# Patient Record
Sex: Male | Born: 1960
Health system: Southern US, Community
[De-identification: ages and names within clinical notes are randomized; demographics above are authoritative.]

## PROBLEM LIST (undated history)

## (undated) DIAGNOSIS — E7800 Pure hypercholesterolemia, unspecified: Secondary | ICD-10-CM

## (undated) DIAGNOSIS — I2510 Atherosclerotic heart disease of native coronary artery without angina pectoris: Secondary | ICD-10-CM

## (undated) DIAGNOSIS — I10 Essential (primary) hypertension: Secondary | ICD-10-CM

## (undated) DIAGNOSIS — M5126 Other intervertebral disc displacement, lumbar region: Secondary | ICD-10-CM

## (undated) HISTORY — DX: Atherosclerotic heart disease of native coronary artery without angina pectoris: I25.10

## (undated) HISTORY — DX: Other intervertebral disc displacement, lumbar region: M51.26

## (undated) HISTORY — PX: WRIST SURGERY: SHX841

---

## 1997-07-10 ENCOUNTER — Ambulatory Visit (HOSPITAL_COMMUNITY): Admission: RE | Admit: 1997-07-10 | Discharge: 1997-07-10 | Payer: Self-pay | Admitting: Orthopedic Surgery

## 1997-08-19 ENCOUNTER — Ambulatory Visit (HOSPITAL_BASED_OUTPATIENT_CLINIC_OR_DEPARTMENT_OTHER): Admission: RE | Admit: 1997-08-19 | Discharge: 1997-08-19 | Payer: Self-pay | Admitting: Orthopedic Surgery

## 1998-06-02 ENCOUNTER — Ambulatory Visit (HOSPITAL_BASED_OUTPATIENT_CLINIC_OR_DEPARTMENT_OTHER): Admission: RE | Admit: 1998-06-02 | Discharge: 1998-06-02 | Payer: Self-pay | Admitting: Orthopedic Surgery

## 2000-11-18 ENCOUNTER — Emergency Department (HOSPITAL_COMMUNITY): Admission: EM | Admit: 2000-11-18 | Discharge: 2000-11-18 | Payer: Self-pay | Admitting: Emergency Medicine

## 2000-12-19 ENCOUNTER — Emergency Department (HOSPITAL_COMMUNITY): Admission: EM | Admit: 2000-12-19 | Discharge: 2000-12-19 | Payer: Self-pay | Admitting: Emergency Medicine

## 2004-06-18 ENCOUNTER — Emergency Department (HOSPITAL_COMMUNITY): Admission: EM | Admit: 2004-06-18 | Discharge: 2004-06-19 | Payer: Self-pay | Admitting: Emergency Medicine

## 2007-07-05 ENCOUNTER — Encounter: Admission: RE | Admit: 2007-07-05 | Discharge: 2007-07-05 | Payer: Self-pay | Admitting: Gastroenterology

## 2008-06-15 ENCOUNTER — Ambulatory Visit (HOSPITAL_COMMUNITY): Admission: RE | Admit: 2008-06-15 | Discharge: 2008-06-15 | Payer: Self-pay | Admitting: Chiropractic Medicine

## 2010-03-27 ENCOUNTER — Encounter: Payer: Self-pay | Admitting: Gastroenterology

## 2010-08-25 ENCOUNTER — Ambulatory Visit: Payer: Self-pay | Attending: Orthopedic Surgery | Admitting: Occupational Therapy

## 2010-08-25 ENCOUNTER — Emergency Department (HOSPITAL_COMMUNITY): Payer: Self-pay

## 2010-08-25 ENCOUNTER — Emergency Department (HOSPITAL_COMMUNITY)
Admission: EM | Admit: 2010-08-25 | Discharge: 2010-08-25 | Disposition: A | Discharge status: Home / Self Care | Payer: Self-pay | Attending: Emergency Medicine | Admitting: Emergency Medicine

## 2010-08-25 DIAGNOSIS — IMO0001 Reserved for inherently not codable concepts without codable children: Secondary | ICD-10-CM | POA: Insufficient documentation

## 2010-08-25 DIAGNOSIS — M25539 Pain in unspecified wrist: Secondary | ICD-10-CM | POA: Insufficient documentation

## 2010-08-25 DIAGNOSIS — W3400XA Accidental discharge from unspecified firearms or gun, initial encounter: Secondary | ICD-10-CM | POA: Insufficient documentation

## 2010-08-25 DIAGNOSIS — S51809A Unspecified open wound of unspecified forearm, initial encounter: Secondary | ICD-10-CM | POA: Insufficient documentation

## 2010-08-25 DIAGNOSIS — R609 Edema, unspecified: Secondary | ICD-10-CM | POA: Insufficient documentation

## 2010-09-01 ENCOUNTER — Ambulatory Visit: Payer: Self-pay | Admitting: Occupational Therapy

## 2010-09-22 ENCOUNTER — Ambulatory Visit: Payer: Self-pay | Attending: Orthopedic Surgery | Admitting: Occupational Therapy

## 2010-09-22 DIAGNOSIS — IMO0001 Reserved for inherently not codable concepts without codable children: Secondary | ICD-10-CM | POA: Insufficient documentation

## 2010-09-22 DIAGNOSIS — R609 Edema, unspecified: Secondary | ICD-10-CM | POA: Insufficient documentation

## 2010-09-22 DIAGNOSIS — M25539 Pain in unspecified wrist: Secondary | ICD-10-CM | POA: Insufficient documentation

## 2012-05-28 ENCOUNTER — Encounter (HOSPITAL_COMMUNITY): Payer: Self-pay

## 2012-05-28 ENCOUNTER — Emergency Department (HOSPITAL_COMMUNITY)
Admission: EM | Admit: 2012-05-28 | Discharge: 2012-05-28 | Disposition: A | Discharge status: Home / Self Care | Payer: Self-pay | Attending: Emergency Medicine | Admitting: Emergency Medicine

## 2012-05-28 ENCOUNTER — Emergency Department (HOSPITAL_COMMUNITY): Payer: Self-pay

## 2012-05-28 DIAGNOSIS — R4701 Aphasia: Secondary | ICD-10-CM | POA: Insufficient documentation

## 2012-05-28 DIAGNOSIS — Z8639 Personal history of other endocrine, nutritional and metabolic disease: Secondary | ICD-10-CM | POA: Insufficient documentation

## 2012-05-28 DIAGNOSIS — G510 Bell's palsy: Secondary | ICD-10-CM | POA: Insufficient documentation

## 2012-05-28 DIAGNOSIS — Z862 Personal history of diseases of the blood and blood-forming organs and certain disorders involving the immune mechanism: Secondary | ICD-10-CM | POA: Insufficient documentation

## 2012-05-28 HISTORY — DX: Pure hypercholesterolemia, unspecified: E78.00

## 2012-05-28 HISTORY — DX: Essential (primary) hypertension: I10

## 2012-05-28 LAB — POCT I-STAT, CHEM 8
Creatinine, Ser: 1.1 mg/dL (ref 0.50–1.35)
HCT: 52 % (ref 39.0–52.0)
Hemoglobin: 17.7 g/dL — ABNORMAL HIGH (ref 13.0–17.0)
Potassium: 3.6 mEq/L (ref 3.5–5.1)
Sodium: 140 mEq/L (ref 135–145)

## 2012-05-28 LAB — CBC
MCV: 87.2 fL (ref 78.0–100.0)
Platelets: 219 10*3/uL (ref 150–400)
RBC: 5.47 MIL/uL (ref 4.22–5.81)
WBC: 6.2 10*3/uL (ref 4.0–10.5)

## 2012-05-28 LAB — GLUCOSE, CAPILLARY: Glucose-Capillary: 152 mg/dL — ABNORMAL HIGH (ref 70–99)

## 2012-05-28 LAB — PROTIME-INR
INR: 0.95 (ref 0.00–1.49)
Prothrombin Time: 12.6 seconds (ref 11.6–15.2)

## 2012-05-28 LAB — POCT I-STAT TROPONIN I: Troponin i, poc: 0 ng/mL (ref 0.00–0.08)

## 2012-05-28 MED ORDER — ACYCLOVIR 400 MG PO TABS: 400.0000 mg | ORAL_TABLET | Freq: Four times a day (QID) | ORAL | Status: DC

## 2012-05-28 MED ORDER — PREDNISONE 10 MG PO TABS: 50.0000 mg | ORAL_TABLET | Freq: Every day | ORAL | Status: DC

## 2012-05-28 MED ORDER — REFRESH P.M. OP OINT: TOPICAL_OINTMENT | OPHTHALMIC | Status: DC | PRN

## 2012-05-28 MED ORDER — TEARS RENEWED OP SOLN: 1.0000 [drp] | Freq: Four times a day (QID) | OPHTHALMIC | Status: DC | PRN

## 2012-05-28 NOTE — ED Notes (Signed)
Patient presents with c/o hypertension. States that he routinely monitors his blood pressure at home. Started noticing that his speech was slurred around 5:30 pm this evening while speaking with his daughter on the telephone and continued to be slurred. Also c/o difficulty closing left eye and left ear pain x 1 day. States that it feels like it's throbbing. Thinks that he may have an ear infection. At triage, patient noted to have slurred speech and left facial droop. No unilateral weakness. Grips present and equal bilaterally. Dr. Read Drivers to triage to see patient. Per Dr. Read Drivers, patient does not seem to be having a stroke at this times and he feels as if the patient's sx is consistent with Bell's Palsy.

## 2012-05-28 NOTE — ED Provider Notes (Addendum)
History     CSN: [id number]  Arrival date & time 05/28/12  0110   First MD Initiated Contact with Patient 05/28/12 0125      Chief Complaint  Patient presents with  . Hypertension    (Consider location/radiation/quality/duration/timing/severity/associated sxs/prior treatment) Patient is a 52 y.o. male presenting with hypertension. The history is provided by the patient.  Hypertension This is a new problem. The current episode started 6 to 12 hours ago. The problem occurs constantly. The problem has not changed since onset.Nothing aggravates the symptoms. Nothing relieves the symptoms. He has tried nothing for the symptoms.    Past Medical History  Diagnosis Date  . Hypertension   . High cholesterol     Past Surgical History  Procedure Laterality Date  . Wrist surgery Right     No family history on file.  History  Substance Use Topics  . Smoking status: Never Smoker   . Smokeless tobacco: Never Used  . Alcohol Use: Yes     Comment: occasionally       Review of Systems  Neurological: Positive for speech difficulty.  All other systems reviewed and are negative.    Allergies  Review of patient's allergies indicates no known allergies.  Home Medications  No current outpatient prescriptions on file.  BP 179/110  Temp(Src) 98.3 F (36.8 C) (Oral)  Resp 18  SpO2 96%  Physical Exam  Constitutional: He is oriented to person, place, and time. He appears well-developed and well-nourished.  HENT:  Head: Normocephalic and atraumatic.  Eyes: Conjunctivae are normal. Pupils are equal, round, and reactive to light.  Neck: Normal range of motion. Neck supple.  Cardiovascular: Normal rate, regular rhythm, normal heart sounds and intact distal pulses.   Pulmonary/Chest: Effort normal and breath sounds normal.  Abdominal: Soft. Bowel sounds are normal.  Neurological: He is alert and oriented to person, place, and time. A cranial nerve deficit is present.  Isolated  left facial palsy.  Skin: Skin is warm and dry.  Psychiatric: He has a normal mood and affect. His behavior is normal. Judgment and thought content normal.    ED Course  Procedures (including critical care time)  Labs Reviewed  CBC  PROTIME-INR   No results found.   No diagnosis found.    MDM  + htn,  Left facial droop isolated to cn 7.  Will lab,  Ct,  reassesx   Ct neg.  Isolated 7th nerve palsy.  Will dc to [facility] neurology,  Ret new/worsening sxs     Yoshiye Kraft Lytle Michaels, MD 05/28/12 0146  Delbert Vu Lytle Michaels, MD 05/28/12 [phone]

## 2013-06-30 ENCOUNTER — Other Ambulatory Visit: Payer: Self-pay | Admitting: Physical Medicine and Rehabilitation

## 2013-06-30 DIAGNOSIS — M549 Dorsalgia, unspecified: Secondary | ICD-10-CM

## 2013-06-30 DIAGNOSIS — M545 Low back pain, unspecified: Secondary | ICD-10-CM

## 2013-06-30 DIAGNOSIS — Z139 Encounter for screening, unspecified: Secondary | ICD-10-CM

## 2013-07-02 ENCOUNTER — Inpatient Hospital Stay: Admission: RE | Admit: 2013-07-02 | Payer: Self-pay | Source: Ambulatory Visit

## 2013-07-02 ENCOUNTER — Other Ambulatory Visit: Payer: Self-pay

## 2013-07-30 ENCOUNTER — Encounter: Payer: Self-pay | Admitting: *Deleted

## 2013-07-31 ENCOUNTER — Ambulatory Visit (INDEPENDENT_AMBULATORY_CARE_PROVIDER_SITE_OTHER): Payer: BC Managed Care – PPO | Admitting: Neurology

## 2013-07-31 ENCOUNTER — Encounter (INDEPENDENT_AMBULATORY_CARE_PROVIDER_SITE_OTHER): Payer: Self-pay

## 2013-07-31 DIAGNOSIS — G510 Bell's palsy: Secondary | ICD-10-CM

## 2013-07-31 DIAGNOSIS — R51 Headache: Secondary | ICD-10-CM

## 2013-07-31 DIAGNOSIS — R29810 Facial weakness: Secondary | ICD-10-CM

## 2013-07-31 HISTORY — DX: Bell's palsy: G51.0

## 2013-07-31 NOTE — Addendum Note (Signed)
Addended by: Ramond Marrow on: 07/31/2013 09:15 AM   Modules accepted: Orders, Level of Service

## 2013-07-31 NOTE — Progress Notes (Addendum)
[facility]    Provider:  Dr Hosie Poisson Referring Provider: [name]* Primary Care Physician:  Default, Provider, MD  CC:  Bells palsy  HPI:  John [name] is a 53 y.o. male here as a referral from Dr. Clint [name] for Bells palsy  States symptoms started March 2014, notes initial sensation of numbness on his tongue, noted a red spot on his tongue, had sore throat, felt sick, noted some twitching of his left eyelid. His friend noted some slurring of his speech and then realized that the whole left side of his face was drooped. Went to the ED, had normal head CT, diagnosed with Bells, given prednisone and an antiviral. Notes symptoms have progressively gotten better but continues to have some weakness around the mouth and eye. Notes some abnormal facial movements (eye closes with smiling) and tears up when eating. Returns today over prognosis and concern over a lump noted in the left occipital region. He does note a progressively worsening pain over that region that radiates down his back.   Review of Systems: Out of a complete 14 system review, the patient complains of only the following symptoms, and all other reviewed systems are negative. + facial weakness  History   Social History  . Marital Status: Divorced    Spouse Name: N/A    Number of Children: N/A  . Years of Education: N/A   Occupational History  . Not on file.   Social History Main Topics  . Smoking status: Former Games developer  . Smokeless tobacco: Never Used  . Alcohol Use: Yes     Comment: occasionally   . Drug Use: No  . Sexual Activity: Not on file   Other Topics Concern  . Not on file   Social History Narrative  . No narrative on file    Family History  Problem Relation Age of Onset  . Stroke    . High Cholesterol    . Hypertension    . Glaucoma    . Diabetes      Past Medical History  Diagnosis Date  . Hypertension   . High cholesterol   . Displacement of lumbar  intervertebral disc without myelopathy     Past Surgical History  Procedure Laterality Date  . Wrist surgery Right     Current Outpatient Prescriptions  Medication Sig Dispense Refill  . acyclovir (ZOVIRAX) 400 MG tablet Take 1 tablet (400 mg total) by mouth 4 (four) times daily.  50 tablet  0  . Artificial Tear Ointment (ARTIFICIAL TEARS) ointment Place into the left eye as needed.  3.5 g  12  . dextran 70-hypromellose (TEARS RENEWED) ophthalmic solution Place 1 drop into the left eye 4 (four) times daily as needed.  15 mL  12  . gabapentin (NEURONTIN) 400 MG capsule Take 400 mg by mouth 3 (three) times daily.      Marland Kitchen losartan (COZAAR) 100 MG tablet Take 100 mg by mouth daily.      . meloxicam (MOBIC) 7.5 MG tablet Take 7.5 mg by mouth daily.      . pravastatin (PRAVACHOL) 40 MG tablet Take 40 mg by mouth daily.      . predniSONE (DELTASONE) 10 MG tablet Take 5 tablets (50 mg total) by mouth daily.  20 tablet  0  . traMADol (ULTRAM) 50 MG tablet Take 50 mg by mouth every 6 (six) hours as needed.       No current facility-administered medications for this visit.  Allergies as of 07/31/2013  . (No Known Allergies)    Vitals: There were no vitals taken for this visit. Last Weight:  Wt Readings from Last 1 Encounters:  No data found for Wt   Last Height:   Ht Readings from Last 1 Encounters:  No data found for Ht     Physical exam: Exam: Gen: NAD, conversant Eyes: anicteric sclerae, moist conjunctivae HENT: Atraumatic, oropharynx clear, small nodular lump on left occipital region, mobile Neck: Trachea midline; supple,  Lungs: CTA, no wheezing, rales, rhonic                          CV: RRR, no MRG Abdomen: Soft, non-tender;  Extremities: No peripheral edema  Skin: Normal temperature, no rash,  Psych: Appropriate affect, pleasant  Neuro: MS: AA&Ox3, appropriately interactive, normal affect   Attention: WORLD backwards  Speech: fluent w/o paraphasic  error  Memory: good recent and remote recall  CN: PERRL, EOMI no nystagmus, no ptosis, sensation intact to LT V1-V3 bilat, decreased left NLF, asymmetric smile and lid closure, hearing grossly intact, palate elevates symmetrically, shoulder shrug 5/5 bilat,  tongue protrudes midline, no fasiculations noted.  Motor: normal bulk and tone Strength: 5/5  In all extremities  Coord: rapid alternating and point-to-point (FNF, HTS) movements intact.  Reflexes: symmetrical, bilat downgoing toes  Sens: LT intact in all extremities  Gait: posture, stance, stride and arm-swing normal. Tandem gait intact. Able to walk on heels and toes. Romberg absent.   Assessment:  After physical and neurologic examination, review of laboratory studies, imaging, neurophysiology testing and pre-existing records, assessment will be reviewed on the problem list.  Plan:  Treatment plan and additional workup will be reviewed under Problem List.  1)Bells palsy 2)Headache  52y/o gentleman presenting for initial evaluation of chronic Bell's palsy with concern over lack of resolution of symptoms and new occipital head pain. Counseled patient that his findings are consistent with a diagnosis of Bell's palsy and that unfortunately not every case fully resolves. He expressed understanding. Due to worsening head pain will check head CT. If negative can consider increasing gabapentin to 600mg  TID. Follow up once CT completed.    Elspeth ChoPeter Palmina Clodfelter, DO  [facility] [address] [place], [place] [id number]  Phone [phone] Fax [phone]

## 2013-08-06 ENCOUNTER — Telehealth: Payer: Self-pay | Admitting: Neurology

## 2013-08-06 NOTE — Telephone Encounter (Signed)
Patient calling about appointment for CT Scan.  Please return call.

## 2013-08-07 ENCOUNTER — Other Ambulatory Visit: Payer: Self-pay | Admitting: Neurology

## 2013-08-07 DIAGNOSIS — R519 Headache, unspecified: Secondary | ICD-10-CM

## 2013-08-07 DIAGNOSIS — R51 Headache: Secondary | ICD-10-CM

## 2013-08-08 NOTE — Telephone Encounter (Signed)
Called pt to inform him that Dr. Hosie Poisson had put the order in for a CT scan and that someone would be getting in contact with him to set up an appt. I gave pt's information to Dr. Minus Breeding assistant Andreas Blower, CMA. I advised the pt that if he has any other problems, questions or concerns to call the office. Pt verbalized understanding.

## 2013-08-08 NOTE — Telephone Encounter (Signed)
Called patient to inform him that his referral for CT scan, has been faxed over to [facility], also informed him that he will be contacted by [facility] with appointment information, patient had no further questions or concerns.

## 2013-08-13 ENCOUNTER — Telehealth: Payer: Self-pay | Admitting: Neurology

## 2013-08-13 NOTE — Telephone Encounter (Signed)
John [name] with [facility] calling to inform Dr. Hosie Poisson patient cancelled CT scan scheduled for 5/11.

## 2013-08-14 ENCOUNTER — Other Ambulatory Visit: Payer: Self-pay

## 2013-08-28 ENCOUNTER — Other Ambulatory Visit: Payer: Self-pay | Admitting: Physical Medicine and Rehabilitation

## 2013-08-28 DIAGNOSIS — R221 Localized swelling, mass and lump, neck: Secondary | ICD-10-CM

## 2013-08-28 DIAGNOSIS — M545 Low back pain, unspecified: Secondary | ICD-10-CM

## 2013-08-28 DIAGNOSIS — M542 Cervicalgia: Secondary | ICD-10-CM

## 2013-09-09 ENCOUNTER — Other Ambulatory Visit: Payer: Self-pay | Admitting: Physical Medicine and Rehabilitation

## 2013-09-09 DIAGNOSIS — Z139 Encounter for screening, unspecified: Secondary | ICD-10-CM

## 2013-09-10 ENCOUNTER — Ambulatory Visit
Admission: RE | Admit: 2013-09-10 | Discharge: 2013-09-10 | Disposition: A | Discharge status: Home / Self Care | Payer: BC Managed Care – PPO | Source: Ambulatory Visit | Attending: Physical Medicine and Rehabilitation | Admitting: Physical Medicine and Rehabilitation

## 2013-09-10 DIAGNOSIS — M545 Low back pain, unspecified: Secondary | ICD-10-CM

## 2013-09-10 DIAGNOSIS — M542 Cervicalgia: Secondary | ICD-10-CM

## 2013-09-10 DIAGNOSIS — R221 Localized swelling, mass and lump, neck: Secondary | ICD-10-CM

## 2013-09-10 DIAGNOSIS — Z139 Encounter for screening, unspecified: Secondary | ICD-10-CM

## 2013-09-10 MED ORDER — GADOBENATE DIMEGLUMINE 529 MG/ML IV SOLN
20.0000 mL | Freq: Once | INTRAVENOUS | Status: AC | PRN
  Administered 2013-09-10: 20 mL via INTRAVENOUS

## 2014-06-21 ENCOUNTER — Encounter (HOSPITAL_BASED_OUTPATIENT_CLINIC_OR_DEPARTMENT_OTHER): Payer: Self-pay

## 2014-11-04 ENCOUNTER — Encounter: Payer: Self-pay | Admitting: Family Medicine

## 2015-02-02 ENCOUNTER — Telehealth: Payer: Self-pay | Admitting: Gastroenterology

## 2015-02-02 NOTE — Telephone Encounter (Signed)
Received [facility] records and placed on Dr. Ardell IsaacsStark's desk for review. Patient states that he does not want to go back to [facility]. He wants to establish with our office. He states that there is no other reason as to why he is transferring. No specific doctor requested.

## 2015-02-03 NOTE — Telephone Encounter (Signed)
Spoke to patient and he did not want to wait until January to be seen.  I have been trying to get in touch with him since august and patient finally returned phone call yesterday. He states that he may stay with [facility]. He wants to think about it. I will place reviewed records in "reviewed records" folder.

## 2016-08-03 ENCOUNTER — Encounter (HOSPITAL_COMMUNITY): Payer: Self-pay

## 2016-08-03 DIAGNOSIS — S40861A Insect bite (nonvenomous) of right upper arm, initial encounter: Secondary | ICD-10-CM | POA: Insufficient documentation

## 2016-08-03 DIAGNOSIS — W57XXXA Bitten or stung by nonvenomous insect and other nonvenomous arthropods, initial encounter: Secondary | ICD-10-CM | POA: Insufficient documentation

## 2016-08-03 DIAGNOSIS — Y999 Unspecified external cause status: Secondary | ICD-10-CM | POA: Diagnosis not present

## 2016-08-03 DIAGNOSIS — Z1839 Other retained organic fragments: Secondary | ICD-10-CM | POA: Diagnosis not present

## 2016-08-03 DIAGNOSIS — I10 Essential (primary) hypertension: Secondary | ICD-10-CM | POA: Insufficient documentation

## 2016-08-03 DIAGNOSIS — Y939 Activity, unspecified: Secondary | ICD-10-CM | POA: Diagnosis not present

## 2016-08-03 DIAGNOSIS — Z79899 Other long term (current) drug therapy: Secondary | ICD-10-CM | POA: Diagnosis not present

## 2016-08-03 DIAGNOSIS — Y929 Unspecified place or not applicable: Secondary | ICD-10-CM | POA: Insufficient documentation

## 2016-08-03 NOTE — ED Triage Notes (Signed)
Pt states that he was bitten by a tick on his R arm and that the head is still in his arm. Pt has tick with him. No fevers

## 2016-08-04 ENCOUNTER — Emergency Department (HOSPITAL_COMMUNITY)
Admission: EM | Admit: 2016-08-04 | Discharge: 2016-08-04 | Disposition: A | Discharge status: Home / Self Care | Payer: BLUE CROSS/BLUE SHIELD | Attending: Emergency Medicine | Admitting: Emergency Medicine

## 2016-08-04 DIAGNOSIS — W57XXXA Bitten or stung by nonvenomous insect and other nonvenomous arthropods, initial encounter: Secondary | ICD-10-CM

## 2016-08-04 NOTE — ED Provider Notes (Signed)
[facility]-EMERGENCY DEPT Provider Note   CSN: [id number] Arrival date & time: 08/03/16  2326     History   Chief Complaint Chief Complaint  Patient presents with  . Tick Removal    HPI John [name] is a 56 y.o. male who presents to the Emergency Department with a tick bite. He states that he noticed the tick on his right arm tonight and removed it, but notes that he was unable to remove the head. He is unsure how long the tick was attached prior to removal. No fever, chills, rash, dyspnea, or other complaints at this time. No other treatment PTA.   The history is provided by the patient. No language interpreter was used.    Past Medical History:  Diagnosis Date  . Displacement of lumbar intervertebral disc without myelopathy   . High cholesterol   . Hypertension     Patient Active Problem List   Diagnosis Date Noted  . Bell's palsy 07/31/2013    Past Surgical History:  Procedure Laterality Date  . WRIST SURGERY Right        Home Medications    Prior to Admission medications   Medication Sig Start Date End Date Taking? Authorizing Provider  acyclovir (ZOVIRAX) 400 MG tablet Take 1 tablet (400 mg total) by mouth 4 (four) times daily. 05/28/12   Mendel RyderSonyika, Chionesu, MD  Artificial Tear Ointment (ARTIFICIAL TEARS) ointment Place into the left eye as needed. 05/28/12   Mendel RyderSonyika, Chionesu, MD  dextran 70-hypromellose (TEARS RENEWED) ophthalmic solution Place 1 drop into the left eye 4 (four) times daily as needed. 05/28/12   Mendel RyderSonyika, Chionesu, MD  gabapentin (NEURONTIN) 400 MG capsule Take 400 mg by mouth 3 (three) times daily.    [provider]  losartan (COZAAR) 100 MG tablet Take 100 mg by mouth daily.    [provider]  meloxicam (MOBIC) 7.5 MG tablet Take 7.5 mg by mouth daily.    [provider]  pravastatin (PRAVACHOL) 40 MG tablet Take 40 mg by mouth daily.    [provider]  predniSONE (DELTASONE) 10 MG tablet Take 5 tablets (50 mg  total) by mouth daily. 05/28/12   Mendel RyderSonyika, Chionesu, MD  traMADol (ULTRAM) 50 MG tablet Take 50 mg by mouth every 6 (six) hours as needed.    [provider]    Family History Family History  Problem Relation Age of Onset  . Stroke Unknown   . High Cholesterol Unknown   . Hypertension Unknown   . Glaucoma Unknown   . Diabetes Unknown     Social History Social History  Substance Use Topics  . Smoking status: Former Games developermoker  . Smokeless tobacco: Never Used  . Alcohol use Yes     Comment: occasionally      Allergies   Patient has no known allergies.   Review of Systems Review of Systems  Constitutional: Negative for chills and fever.  HENT: Negative for ear pain and sore throat.   Eyes: Negative for pain and visual disturbance.  Respiratory: Negative for cough and shortness of breath.   Cardiovascular: Negative for chest pain and palpitations.  Gastrointestinal: Negative for abdominal pain, diarrhea, nausea and vomiting.  Genitourinary: Negative for dysuria and hematuria.  Musculoskeletal: Negative for arthralgias and back pain.  Skin: Positive for wound. Negative for color change and rash.  Neurological: Negative for seizures and syncope.  All other systems reviewed and are negative.    Physical Exam Updated Vital Signs BP (!) 142/90 (BP Location:  Right Arm)   Pulse 84   Temp 98.7 F (37.1 C) (Oral)   Resp 18   SpO2 99%   Physical Exam  Constitutional: He appears well-developed and well-nourished.  HENT:  Head: Normocephalic and atraumatic.  Eyes: Conjunctivae are normal.  Neck: Neck supple.  Cardiovascular: Normal rate and regular rhythm.   No murmur heard. Pulmonary/Chest: Effort normal and breath sounds normal. No respiratory distress.  Abdominal: Soft. There is no tenderness.  Neurological: He is alert.  Skin: Skin is warm and dry.  A miniscule opening to the posterior aspect of the right arm with the possible mouthparts of a tick remaining.  No surrounding induration, erythema, warmth, or swelling.   Psychiatric: He has a normal mood and affect.  Nursing note and vitals reviewed.    ED Treatments / Results  Labs (all labs ordered are listed, but only abnormal results are displayed) Labs Reviewed - No data to display  EKG  EKG Interpretation None       Radiology No results found.  Procedures Procedures (including critical care time)  Medications Ordered in ED Medications - No data to display   Initial Impression / Assessment and Plan / ED Course  I have reviewed the triage vital signs and the nursing notes.  Pertinent labs & imaging results that were available during my care of the patient were reviewed by me and considered in my medical decision making (see chart for details).     56 y.o. male presents to the Emergency Department for a tick bite. He removed the body of the tick at home after noticing it earlier tonight, but was unable to remove all of the mouth parts. No rash or evidence of local infection was noted at the site. On exam, it appears that some minute mouthparts remain in the posterior aspect of the right arm, but are not amenable to removal with forceps. Educated the patient that embedded mouthparts will typically be expelled spontaneously. At this time, no prophylactic treatment for [facility] Spotted Fever, Lyme's Disease or other tickborne illness is indicated. Discussed with the patient signs/symptoms of tickborne illness and infection and strict return precautions were given. Encouraged him to keep the area clean with warm soap and water. The patient's BP is improving from 171/96 to 142/90 since arrival in the ED. The patient is safe and stable for d/c at this time.    Final Clinical Impressions(s) / ED Diagnoses   Final diagnoses:  Tick bite, initial encounter    New Prescriptions Discharge Medication List as of 08/04/2016  2:52 AM       Naveena Eyman A, PA-C 08/07/16 1451      Catalia Massett A, PA-C 08/07/16 1452    Ward, Layla Maw, DO 08/09/16 0116

## 2016-08-04 NOTE — ED Notes (Signed)
Pt left after speaking with provider. States "I waited 3 hours for you to tell me that?"   Pt left department.

## 2016-11-23 NOTE — Telephone Encounter (Signed)
Patient has not returned phone calls. Records have been shredded. °

## 2016-12-14 ENCOUNTER — Encounter: Payer: Self-pay | Admitting: Family Medicine

## 2019-04-28 ENCOUNTER — Telehealth: Payer: Self-pay

## 2019-04-28 NOTE — Telephone Encounter (Signed)
NOTES ON FILE FROM PREFERRED PRIMARY CARE [phone]  SENT REFERRAL TO SCHEDULING

## 2019-08-05 ENCOUNTER — Ambulatory Visit: Payer: BLUE CROSS/BLUE SHIELD | Admitting: Cardiology

## 2019-08-06 ENCOUNTER — Ambulatory Visit (INDEPENDENT_AMBULATORY_CARE_PROVIDER_SITE_OTHER): Payer: Managed Care, Other (non HMO) | Admitting: Cardiovascular Disease

## 2019-08-06 ENCOUNTER — Telehealth (HOSPITAL_COMMUNITY): Payer: Self-pay | Admitting: Cardiovascular Disease

## 2019-08-06 ENCOUNTER — Encounter: Payer: Self-pay | Admitting: Cardiovascular Disease

## 2019-08-06 ENCOUNTER — Other Ambulatory Visit: Payer: Self-pay

## 2019-08-06 ENCOUNTER — Encounter: Payer: Self-pay | Admitting: *Deleted

## 2019-08-06 VITALS — BP 130/72 | HR 72 | Ht 72.0 in | Wt 225.5 lb

## 2019-08-06 DIAGNOSIS — I209 Angina pectoris, unspecified: Secondary | ICD-10-CM

## 2019-08-06 DIAGNOSIS — R079 Chest pain, unspecified: Secondary | ICD-10-CM

## 2019-08-06 DIAGNOSIS — I200 Unstable angina: Secondary | ICD-10-CM | POA: Insufficient documentation

## 2019-08-06 NOTE — Progress Notes (Signed)
Cardiology Office Note:    Date:  08/06/2019   ID:  John [name], DOB 03/26/1960, MRN [id number]  PCP:  Armando Gang, FNP  Cardiologist:  Zuria Fosdick  Electrophysiologist:  None   Referring MD: Laurier Nancy, MD   Chief Complaint  Patient presents with  . Hypertension  . Hyperlipidemia    History of Present Illness:    John [name] is a 59 y.o. male with a hx of hypertension and hyperlipidemia.  He is seen today for further evaluation of some exertional chest discomfort.  We are asked to see him today by Dr. Welton Flakes  for further evaluation of this exertional chest pain.  Started having exertional CP while push mowing his yard in 2019. Would have to stop and rest. Later , he noticed CP while climbing the [place].  CP was achey,  Would last several minutes.  Would have to stop and relax .  Associated with sweats and dyspnea.   No dizziness  Is having to limit his exercise because of this cp .   Was in the hospital at [facility] with cp.   Does not recall whether or not he had a stress test.    Past Medical History:  Diagnosis Date  . Bell's palsy 07/31/2013  . Displacement of lumbar intervertebral disc without myelopathy   . High cholesterol   . Hypertension     Past Surgical History:  Procedure Laterality Date  . WRIST SURGERY Right     Current Medications: Current Meds  Medication Sig  . acyclovir (ZOVIRAX) 400 MG tablet Take 1 tablet (400 mg total) by mouth 4 (four) times daily.  . Artificial Tear Ointment (ARTIFICIAL TEARS) ointment Place into the left eye as needed.  Marland Kitchen aspirin EC 81 MG tablet Take 81 mg by mouth daily.  Marland Kitchen BLACK CURRANT SEED OIL PO Take 1,250 mg by mouth daily.  . cetirizine (ZYRTEC) 10 MG tablet Take 10 mg by mouth daily as needed for allergies.  . Cholecalciferol (VITAMIN D-3) 125 MCG (5000 UT) TABS Take 1 tablet by mouth with breakfast, with lunch, and with evening meal.  . dextran 70-hypromellose (TEARS RENEWED) ophthalmic  solution Place 1 drop into the left eye 4 (four) times daily as needed.  . gabapentin (NEURONTIN) 400 MG capsule Take 400 mg by mouth 2 (two) times daily as needed.   Marland Kitchen ibuprofen (ADVIL) 800 MG tablet Take 800 mg by mouth 3 (three) times daily as needed.  Marland Kitchen losartan (COZAAR) 100 MG tablet Take 100 mg by mouth daily.  . meloxicam (MOBIC) 7.5 MG tablet Take 7.5 mg by mouth daily.  . nitroGLYCERIN (NITROSTAT) 0.3 MG SL tablet Place 1 tablet under the tongue as needed.  . OIL OF OREGANO PO Take 150 mg by mouth daily.  . pravastatin (PRAVACHOL) 40 MG tablet Take 40 mg by mouth daily.  . predniSONE (DELTASONE) 10 MG tablet Take 5 tablets (50 mg total) by mouth daily.  . traMADol (ULTRAM) 50 MG tablet Take 50 mg by mouth every 6 (six) hours as needed.     Allergies:   Patient has no known allergies.   Social History   Socioeconomic History  . Marital status: Divorced    Spouse name: Not on file  . Number of children: Not on file  . Years of education: Not on file  . Highest education level: Not on file  Occupational History  . Not on file  Tobacco Use  . Smoking status: Former Games developer  .  Smokeless tobacco: Never Used  Substance and Sexual Activity  . Alcohol use: Yes    Comment: occasionally   . Drug use: No  . Sexual activity: Not on file  Other Topics Concern  . Not on file  Social History Narrative  . Not on file   Social Determinants of Health   Financial Resource Strain:   . Difficulty of Paying Living Expenses:   Food Insecurity:   . Worried About Programme researcher, broadcasting/film/video in the Last Year:   . Barista in the Last Year:   Transportation Needs:   . Freight forwarder (Medical):   Marland Kitchen Lack of Transportation (Non-Medical):   Physical Activity:   . Days of Exercise per Week:   . Minutes of Exercise per Session:   Stress:   . Feeling of Stress :   Social Connections:   . Frequency of Communication with Friends and Family:   . Frequency of Social Gatherings with  Friends and Family:   . Attends Religious Services:   . Active Member of Clubs or Organizations:   . Attends Banker Meetings:   Marland Kitchen Marital Status:      Family History: The patient's family history includes Diabetes in his unknown relative; Glaucoma in his unknown relative; High Cholesterol in his unknown relative; Hypertension in his unknown relative; Stroke in his unknown relative.  ROS:   Please see the history of present illness.     All other systems reviewed and are negative.  EKGs/Labs/Other Studies Reviewed:    The following studies were reviewed today:   EKG:   August 06, 2019: Normal sinus rhythm at 65.  No ST or T wave changes.    Recent Labs: No results found for requested labs within last 8760 hours.  Recent Lipid Panel No results found for: CHOL, TRIG, HDL, CHOLHDL, VLDL, LDLCALC, LDLDIRECT  Physical Exam:    VS:  BP 130/72   Pulse 72   Ht 6' (1.829 m)   Wt 225 lb 8 oz (102.3 kg)   SpO2 96%   BMI 30.58 kg/m     Wt Readings from Last 3 Encounters:  08/06/19 225 lb 8 oz (102.3 kg)     GEN:  Well nourished, well developed in no acute distress HEENT: Normal NECK: No JVD; No carotid bruits LYMPHATICS: No lymphadenopathy CARDIAC: RRR, no murmurs, rubs, gallops RESPIRATORY:  Clear to auscultation without rales, wheezing or rhonchi  ABDOMEN: Soft, non-tender, non-distended MUSCULOSKELETAL:  No edema; No deformity  SKIN: Warm and dry NEUROLOGIC:  Alert and oriented x 3 PSYCHIATRIC:  Normal affect   ASSESSMENT:    1. Chest pain of uncertain etiology    PLAN:    In order of problems listed above:  1. Exertional chest pain: I long discussion with Mr. Reinhardt.  He is having angina that occurs with moderate exercise.  It seems like his symptoms are progressive.  We discussed progress going directly to heart catheterization but he would rather do a stress test.  We will we will refer him for a stress Myoview study.  We have already discussed heart  catheterization including the risk, benefits, options.  He is on aspirin 81 mg a day.  He already has nitroglycerin to take.  We will refer him for stress testing and I will see him again in several months.  Will refer him for heart catheterization if the stress test is positive.   Medication Adjustments/Labs and Tests Ordered: Current medicines are reviewed at  length with the patient today.  Concerns regarding medicines are outlined above.  Orders Placed This Encounter  Procedures  . Myocardial Perfusion Imaging  . EKG 12-Lead   No orders of the defined types were placed in this encounter.   Patient Instructions  Medication Instructions:  Your physician recommends that you continue on your current medications as directed. Please refer to the Current Medication list given to you today.  *If you need a refill on your cardiac medications before your next appointment, please call your pharmacy*   Lab Work: NONE If you have labs (blood work) drawn today and your tests are completely normal, you will receive your results only by: Marland Kitchen MyChart Message (if you have MyChart) OR . A paper copy in the mail If you have any lab test that is abnormal or we need to change your treatment, we will call you to review the results.   Testing/Procedures: Your physician has requested that you have en exercise stress myoview. For further information please visit [email or web address]. Please follow instruction sheet, as given.    Follow-Up: At [facility], you and your health needs are our priority.  As part of our continuing mission to provide you with exceptional heart care, we have created designated Provider Care Teams.  These Care Teams include your primary Cardiologist (physician) and Advanced Practice Providers (APPs -  Physician Assistants and Nurse Practitioners) who all work together to provide you with the care you need, when you need it.  We recommend signing up for the patient portal  called "MyChart".  Sign up information is provided on this After Visit Summary.  MyChart is used to connect with patients for Virtual Visits (Telemedicine).  Patients are able to view lab/test results, encounter notes, upcoming appointments, etc.  Non-urgent messages can be sent to your provider as well.   To learn more about what you can do with MyChart, go to [email or web address].    Your next appointment:   2 month(s)  The format for your next appointment:   In Person  Provider:   Mertie Moores, MD   Other Instructions NONE     Signed, Mertie Moores, MD  08/06/2019 12:41 PM    [place]

## 2019-08-06 NOTE — Telephone Encounter (Signed)
Patient is scheduled for Exercise Myoview and he will require COVID test and 4 day quarantine before his appt on 08/15/2019. I spoke with patient and he was driving and had to check his work schedule 1st to even confirm the stress test date.. he will call me back. I informed he we were unable to do test in the office without COVID test results Per Durwin Reges.

## 2019-08-06 NOTE — Patient Instructions (Signed)
Medication Instructions:  Your physician recommends that you continue on your current medications as directed. Please refer to the Current Medication list given to you today.  *If you need a refill on your cardiac medications before your next appointment, please call your pharmacy*   Lab Work: NONE If you have labs (blood work) drawn today and your tests are completely normal, you will receive your results only by: Marland Kitchen MyChart Message (if you have MyChart) OR . A paper copy in the mail If you have any lab test that is abnormal or we need to change your treatment, we will call you to review the results.   Testing/Procedures: Your physician has requested that you have en exercise stress myoview. For further information please visit [email or web address] Please follow instruction sheet, as given.    Follow-Up: At [facility], you and your health needs are our priority.  As part of our continuing mission to provide you with exceptional heart care, we have created designated Provider Care Teams.  These Care Teams include your primary Cardiologist (physician) and Advanced Practice Providers (APPs -  Physician Assistants and Nurse Practitioners) who all work together to provide you with the care you need, when you need it.  We recommend signing up for the patient portal called "MyChart".  Sign up information is provided on this After Visit Summary.  MyChart is used to connect with patients for Virtual Visits (Telemedicine).  Patients are able to view lab/test results, encounter notes, upcoming appointments, etc.  Non-urgent messages can be sent to your provider as well.   To learn more about what you can do with MyChart, go to [email or web address].    Your next appointment:   2 month(s)  The format for your next appointment:   In Person  Provider:   Kristeen Miss, MD   Other Instructions NONE

## 2019-08-12 ENCOUNTER — Telehealth (HOSPITAL_COMMUNITY): Payer: Self-pay | Admitting: *Deleted

## 2019-08-12 NOTE — Telephone Encounter (Signed)
Left message on voicemail per DPR in reference to upcoming appointment scheduled on 08/15/19 at 7:30 with detailed instructions given per Myocardial Perfusion Study Information Sheet for the test. LM to arrive 15 minutes early, and that it is imperative to arrive on time for appointment to keep from having the test rescheduled. If you need to cancel or reschedule your appointment, please call the office within 24 hours of your appointment. Failure to do so may result in a cancellation of your appointment, and a $50 no show fee. Phone number given for call back for any questions.

## 2019-08-15 ENCOUNTER — Encounter (HOSPITAL_COMMUNITY): Payer: Managed Care, Other (non HMO)

## 2019-08-28 ENCOUNTER — Telehealth (HOSPITAL_COMMUNITY): Payer: Self-pay | Admitting: Cardiovascular Disease

## 2019-08-28 NOTE — Telephone Encounter (Signed)
Called patient and left VM to call the office to verify the  appointment for Myoview in September that I scheduled per Dr. Melburn Popper. Just need to confirm date and time with patient.

## 2019-10-06 ENCOUNTER — Encounter: Payer: Self-pay | Admitting: Cardiovascular Disease

## 2019-10-06 NOTE — Progress Notes (Signed)
Cardiology Office Note:    Date:  10/07/2019   ID:  John [name], DOB [birth year], MRN [id number]  PCP:  John Gang, FNP  Cardiologist:  Beda Dula  Electrophysiologist:  None   Referring MD: John Gang, FNP   Chief Complaint  Patient presents with  . Chest Pain    History of Present Illness:    John [name] is a 59 y.o. male with a hx of hypertension and hyperlipidemia.  He is seen today for further evaluation of some exertional chest discomfort.  We are asked to see him today by Dr. Welton [name]  for further evaluation of this exertional chest pain.  Started having exertional CP while push mowing his yard in 2019. Would have to stop and rest. Later , he noticed CP while [place].  CP was achey,  Would last several minutes.  Would have to stop and relax .  Associated with sweats and dyspnea.   No dizziness  Is having to limit his exercise because of this cp .   Was in the hospital at [facility] with cp.   Does not recall whether or not he had a stress test.    October 07, 2019:  Mr. John [name] is seen today for follow-up visit.  We saw him several months ago he was having episodes of chest discomfort.  We discussed heart catheterization and also discussed stress testing.  We schedule him for stress test but he never test done.  His sone passed away in 08/27/2022 and did not make it to the stress appt.  Still has some CP.  Occurs with heavy lifting and stress.  Pains last for a minute or so .  He has been having exertional chest pain for the past year or so.  They seem to be progressing.  Past Medical History:  Diagnosis Date  . Bell's palsy 07/31/2013  . Displacement of lumbar intervertebral disc without myelopathy   . High cholesterol   . Hypertension     Past Surgical History:  Procedure Laterality Date  . WRIST SURGERY Right     Current Medications: Current Meds  Medication Sig  . Artificial Tear Ointment (ARTIFICIAL TEARS) ointment Place  into the left eye as needed.  Marland Kitchen aspirin EC 81 MG tablet Take 81 mg by mouth daily.  Marland Kitchen BLACK CURRANT SEED OIL PO Take 1,250 mg by mouth daily.  . cetirizine (ZYRTEC) 10 MG tablet Take 10 mg by mouth daily as needed for allergies.  . Cholecalciferol (VITAMIN D-3) 125 MCG (5000 UT) TABS Take 1 tablet by mouth with breakfast, with lunch, and with evening meal.  . dextran 70-hypromellose (TEARS RENEWED) ophthalmic solution Place 1 drop into the left eye 4 (four) times daily as needed.  Marland Kitchen FLUoxetine (PROZAC) 10 MG capsule Take 10 mg by mouth daily.   Marland Kitchen gabapentin (NEURONTIN) 400 MG capsule Take 400 mg by mouth 2 (two) times daily as needed.   Marland Kitchen ibuprofen (ADVIL) 800 MG tablet Take 800 mg by mouth 3 (three) times daily as needed.  Marland Kitchen losartan (COZAAR) 100 MG tablet Take 100 mg by mouth daily.  . nitroGLYCERIN (NITROSTAT) 0.3 MG SL tablet Place 1 tablet under the tongue as needed.  . OIL OF OREGANO PO Take 150 mg by mouth daily.  . pravastatin (PRAVACHOL) 40 MG tablet Take 40 mg by mouth daily.     Allergies:   Patient has no known allergies.   Social History   Socioeconomic History  . Marital  status: Divorced    Spouse name: Not on file  . Number of children: Not on file  . Years of education: Not on file  . Highest education level: Not on file  Occupational History  . Not on file  Tobacco Use  . Smoking status: Former Games developer  . Smokeless tobacco: Never Used  Substance and Sexual Activity  . Alcohol use: Yes    Comment: occasionally   . Drug use: No  . Sexual activity: Not on file  Other Topics Concern  . Not on file  Social History Narrative  . Not on file   Social Determinants of Health   Financial Resource Strain:   . Difficulty of Paying Living Expenses:   Food Insecurity:   . Worried About Programme researcher, broadcasting/film/video in the Last Year:   . Barista in the Last Year:   Transportation Needs:   . Freight forwarder (Medical):   Marland Kitchen Lack of Transportation (Non-Medical):     Physical Activity:   . Days of Exercise per Week:   . Minutes of Exercise per Session:   Stress:   . Feeling of Stress :   Social Connections:   . Frequency of Communication with Friends and Family:   . Frequency of Social Gatherings with Friends and Family:   . Attends Religious Services:   . Active Member of Clubs or Organizations:   . Attends Banker Meetings:   Marland Kitchen Marital Status:      Family History: The patient's family history includes Diabetes in his unknown relative; Glaucoma in his unknown relative; High Cholesterol in his unknown relative; Hypertension in his unknown relative; Stroke in his unknown relative.  ROS:   Please see the history of present illness.     All other systems reviewed and are negative.  EKGs/Labs/Other Studies Reviewed:    The following studies were reviewed today:   EKG:        Recent Labs: 10/07/2019: BUN 12; Creatinine, Ser 0.96; Hemoglobin WILL FOLLOW; Platelets WILL FOLLOW; Potassium 4.7; Sodium 142  Recent Lipid Panel    Component Value Date/Time   CHOL 150 10/07/2019 1103   TRIG 113 10/07/2019 1103   HDL 43 10/07/2019 1103   CHOLHDL 3.5 10/07/2019 1103   LDLCALC 86 10/07/2019 1103    Physical Exam:    Physical Exam: Blood pressure 138/82, pulse 74, height 6' (1.829 m), weight 214 lb 3.2 oz (97.2 kg), SpO2 97 %.  GEN:  Well nourished, well developed in no acute distress HEENT: Normal NECK: No JVD; No carotid bruits LYMPHATICS: No lymphadenopathy CARDIAC: RRR , no murmurs, rubs, gallops RESPIRATORY:  Clear to auscultation without rales, wheezing or rhonchi  ABDOMEN: Soft, non-tender, non-distended MUSCULOSKELETAL:  No edema; No deformity  SKIN: Warm and dry NEUROLOGIC:  Alert and oriented x 3   ASSESSMENT:    1. Chest pain of uncertain etiology   2. Angina pectoris (HCC)    PLAN:    In order of problems listed above:  Exertional chest pain: I Mr. John [name] is seen back today for follow-up of his exertional  chest pain.  He missed his stress test appointment because his son passed away in 08-26-22.  At this point his pains have progressed and he would like to go and get a heart catheterization.  His pains are fairly predictable with exercise.  We discussed the risks, benefits, options of heart catheterization.   he understands and agrees to proceed.  2.  Hyperlipidemia: We will  draw lipids today along with his precath labs.   Medication Adjustments/Labs and Tests Ordered: Current medicines are reviewed at length with the patient today.  Concerns regarding medicines are outlined above.  Orders Placed This Encounter  Procedures  . Basic metabolic panel  . CBC  . Lipid panel   No orders of the defined types were placed in this encounter.    Patient Instructions  Medication Instructions:  Your physician recommends that you continue on your current medications as directed. Please refer to the Current Medication list given to you today.  *If you need a refill on your cardiac medications before your next appointment, please call your pharmacy*  Lab Work: TODAY: FLP, BMET, CBC  If you have labs (blood work) drawn today and your tests are completely normal, you will receive your results only by: . MyChart Message (if you have MyChart) OR . A paper copy in the mail If you have any lab test that is abnormal or we need to change your treatment, we will call you to review the results.   Testing/Procedures: Your physician has requested that you have a cardiac catheterization. Cardiac catheterization is used to diagnose and/or treat various heart conditions. Doctors may recommend this procedure for a number of different reasons. The most common reason is to evaluate chest pain. Chest pain can be a symptom of coronary artery disease (CAD), and cardiac catheterization can show whether plaque is narrowing or blocking your heart's arteries. This procedure is also used to evaluate the valves, as well as  measure the blood flow and oxygen levels in different parts of your heart. For further information please visit www.cardiosmart.org. Please follow instruction sheet, as given.   Follow-Up: At [facility], you and your health needs are our priority.  As part of our continuing mission to provide you with exceptional heart care, we have created designated Provider Care Teams.  These Care Teams include your primary Cardiologist (physician) and Advanced Practice Providers (APPs -  Physician Assistants and Nurse Practitioners) who all work together to provide you with the care you need, when you need it.  We recommend signing up for the patient portal called "MyChart".  Sign up information is provided on this After Visit Summary.  MyChart is used to connect with patients for Virtual Visits (Telemedicine).  Patients are able to view lab/test results, encounter notes, upcoming appointments, etc.  Non-urgent messages can be sent to your provider as well.   To learn more about what you can do with MyChart, go to [email or web address].    Your next appointment:   3 month(s)  The format for your next appointment:   In Person  Provider:   You may see Mussa Groesbeck, MD or one of the following Advanced Practice Providers on your designated Care Team:    Scott Weaver, PA-C  Vin Bhagat, PA-C   Other Instructions    [facility] [address] [facility] [address], [address] [place] [address] Dept: [phone] Loc: [phone]  Macsen F Kinnick  10/07/2019  You are scheduled for a Cardiac Catheterization on Monday, August 30 with Dr. Jayadeep Varanasi.  1. Please arrive at the [facility] Tower (Main Entrance A) at [facility]: [address] [place], [place] [address] at 7:00 AM (This time is two hours before your procedure to ensure your preparation). Free valet parking service is available.   Special note: Every effort  is made to have your procedure done on   time. Please understand that emergencies sometimes delay scheduled procedures.  2. Diet: Do not eat solid foods after midnight.  The patient may have clear liquids until 5am upon the day of the procedure.  3. Labs: You will need to have blood drawn on Tuesday, August 3 at [facility] at [facility]. [address], [place]  Open: 7:30am - 5pm    Phone: [phone]. You do not need to be fasting.  4. COVID Screening on Saturday, August 28th, 2021 at 11:00am at [address] [place], [place]  [address]  5. Medication instructions in preparation for your procedure:   Contrast Allergy: No   On the morning of your procedure, take your Aspirin and any morning medicines NOT listed above.  You may use sips of water.  5. Plan for one night stay--bring personal belongings. 6. Bring a current list of your medications and current insurance cards. 7. You MUST have a responsible person to drive you home. 8. Someone MUST be with you the first 24 hours after you arrive home or your discharge will be delayed. 9. Please wear clothes that are easy to get on and off and wear slip-on shoes.  Thank you for allowing [place] to care for you!   -- [facility] Invasive Cardiovascular services      Signed, Kristeen Miss, MD  10/07/2019 5:23 PM    [facility]

## 2019-10-06 NOTE — H&P (View-Only) (Signed)
Cardiology Office Note:    Date:  10/07/2019   ID:  John [name], DOB [birth year], MRN [id number]  PCP:  Armando Gang, FNP  Cardiologist:  Aydenn Gervin  Electrophysiologist:  None   Referring MD: Armando Gang, FNP   Chief Complaint  Patient presents with  . Chest Pain    History of Present Illness:    John [name] is a 59 y.o. male with a hx of hypertension and hyperlipidemia.  He is seen today for further evaluation of some exertional chest discomfort.  We are asked to see him today by Dr. Welton Flakes  for further evaluation of this exertional chest pain.  Started having exertional CP while push mowing his yard in 2019. Would have to stop and rest. Later , he noticed CP while climbing the [place].  CP was achey,  Would last several minutes.  Would have to stop and relax .  Associated with sweats and dyspnea.   No dizziness  Is having to limit his exercise because of this cp .   Was in the hospital at [facility] with cp.   Does not recall whether or not he had a stress test.    October 07, 2019:  Mr. John [name] is seen today for follow-up visit.  We saw him several months ago he was having episodes of chest discomfort.  We discussed heart catheterization and also discussed stress testing.  We schedule him for stress test but he never test done.  His sone passed away in 08/27/2022 and did not make it to the stress appt.  Still has some CP.  Occurs with heavy lifting and stress.  Pains last for a minute or so .  He has been having exertional chest pain for the past year or so.  They seem to be progressing.  Past Medical History:  Diagnosis Date  . Bell's palsy 07/31/2013  . Displacement of lumbar intervertebral disc without myelopathy   . High cholesterol   . Hypertension     Past Surgical History:  Procedure Laterality Date  . WRIST SURGERY Right     Current Medications: Current Meds  Medication Sig  . Artificial Tear Ointment (ARTIFICIAL TEARS) ointment Place  into the left eye as needed.  Marland Kitchen aspirin EC 81 MG tablet Take 81 mg by mouth daily.  Marland Kitchen BLACK CURRANT SEED OIL PO Take 1,250 mg by mouth daily.  . cetirizine (ZYRTEC) 10 MG tablet Take 10 mg by mouth daily as needed for allergies.  . Cholecalciferol (VITAMIN D-3) 125 MCG (5000 UT) TABS Take 1 tablet by mouth with breakfast, with lunch, and with evening meal.  . dextran 70-hypromellose (TEARS RENEWED) ophthalmic solution Place 1 drop into the left eye 4 (four) times daily as needed.  Marland Kitchen FLUoxetine (PROZAC) 10 MG capsule Take 10 mg by mouth daily.   Marland Kitchen gabapentin (NEURONTIN) 400 MG capsule Take 400 mg by mouth 2 (two) times daily as needed.   Marland Kitchen ibuprofen (ADVIL) 800 MG tablet Take 800 mg by mouth 3 (three) times daily as needed.  Marland Kitchen losartan (COZAAR) 100 MG tablet Take 100 mg by mouth daily.  . nitroGLYCERIN (NITROSTAT) 0.3 MG SL tablet Place 1 tablet under the tongue as needed.  . OIL OF OREGANO PO Take 150 mg by mouth daily.  . pravastatin (PRAVACHOL) 40 MG tablet Take 40 mg by mouth daily.     Allergies:   Patient has no known allergies.   Social History   Socioeconomic History  . Marital  status: Divorced    Spouse name: Not on file  . Number of children: Not on file  . Years of education: Not on file  . Highest education level: Not on file  Occupational History  . Not on file  Tobacco Use  . Smoking status: Former Games developer  . Smokeless tobacco: Never Used  Substance and Sexual Activity  . Alcohol use: Yes    Comment: occasionally   . Drug use: No  . Sexual activity: Not on file  Other Topics Concern  . Not on file  Social History Narrative  . Not on file   Social Determinants of Health   Financial Resource Strain:   . Difficulty of Paying Living Expenses:   Food Insecurity:   . Worried About Programme researcher, broadcasting/film/video in the Last Year:   . Barista in the Last Year:   Transportation Needs:   . Freight forwarder (Medical):   Marland Kitchen Lack of Transportation (Non-Medical):     Physical Activity:   . Days of Exercise per Week:   . Minutes of Exercise per Session:   Stress:   . Feeling of Stress :   Social Connections:   . Frequency of Communication with Friends and Family:   . Frequency of Social Gatherings with Friends and Family:   . Attends Religious Services:   . Active Member of Clubs or Organizations:   . Attends Banker Meetings:   Marland Kitchen Marital Status:      Family History: The patient's family history includes Diabetes in his unknown relative; Glaucoma in his unknown relative; High Cholesterol in his unknown relative; Hypertension in his unknown relative; Stroke in his unknown relative.  ROS:   Please see the history of present illness.     All other systems reviewed and are negative.  EKGs/Labs/Other Studies Reviewed:    The following studies were reviewed today:   EKG:        Recent Labs: 10/07/2019: BUN 12; Creatinine, Ser 0.96; Hemoglobin WILL FOLLOW; Platelets WILL FOLLOW; Potassium 4.7; Sodium 142  Recent Lipid Panel    Component Value Date/Time   CHOL 150 10/07/2019 1103   TRIG 113 10/07/2019 1103   HDL 43 10/07/2019 1103   CHOLHDL 3.5 10/07/2019 1103   LDLCALC 86 10/07/2019 1103    Physical Exam:    Physical Exam: Blood pressure 138/82, pulse 74, height 6' (1.829 m), weight 214 lb 3.2 oz (97.2 kg), SpO2 97 %.  GEN:  Well nourished, well developed in no acute distress HEENT: Normal NECK: No JVD; No carotid bruits LYMPHATICS: No lymphadenopathy CARDIAC: RRR , no murmurs, rubs, gallops RESPIRATORY:  Clear to auscultation without rales, wheezing or rhonchi  ABDOMEN: Soft, non-tender, non-distended MUSCULOSKELETAL:  No edema; No deformity  SKIN: Warm and dry NEUROLOGIC:  Alert and oriented x 3   ASSESSMENT:    1. Chest pain of uncertain etiology   2. Angina pectoris (HCC)    PLAN:    In order of problems listed above:  Exertional chest pain: I [name] is seen back today for follow-up of his exertional  chest pain.  He missed his stress test appointment because his son passed away in 08-26-22.  At this point his pains have progressed and he would like to go and get a heart catheterization.  His pains are fairly predictable with exercise.  We discussed the risks, benefits, options of heart catheterization.   he understands and agrees to proceed.  2.  Hyperlipidemia: We will  draw lipids today along with his precath labs.   Medication Adjustments/Labs and Tests Ordered: Current medicines are reviewed at length with the patient today.  Concerns regarding medicines are outlined above.  Orders Placed This Encounter  Procedures  . Basic metabolic panel  . CBC  . Lipid panel   No orders of the defined types were placed in this encounter.    Patient Instructions  Medication Instructions:  Your physician recommends that you continue on your current medications as directed. Please refer to the Current Medication list given to you today.  *If you need a refill on your cardiac medications before your next appointment, please call your pharmacy*  Lab Work: TODAY: FLP, BMET, CBC  If you have labs (blood work) drawn today and your tests are completely normal, you will receive your results only by: Marland Kitchen. MyChart Message (if you have MyChart) OR . A paper copy in the mail If you have any lab test that is abnormal or we need to change your treatment, we will call you to review the results.   Testing/Procedures: Your physician has requested that you have a cardiac catheterization. Cardiac catheterization is used to diagnose and/or treat various heart conditions. Doctors may recommend this procedure for a number of different reasons. The most common reason is to evaluate chest pain. Chest pain can be a symptom of coronary artery disease (CAD), and cardiac catheterization can show whether plaque is narrowing or blocking your heart's arteries. This procedure is also used to evaluate the valves, as well as  measure the blood flow and oxygen levels in different parts of your heart. For further information please visit [email or web address]. Please follow instruction sheet, as given.   Follow-Up: At [facility], you and your health needs are our priority.  As part of our continuing mission to provide you with exceptional heart care, we have created designated Provider Care Teams.  These Care Teams include your primary Cardiologist (physician) and Advanced Practice Providers (APPs -  Physician Assistants and Nurse Practitioners) who all work together to provide you with the care you need, when you need it.  We recommend signing up for the patient portal called "MyChart".  Sign up information is provided on this After Visit Summary.  MyChart is used to connect with patients for Virtual Visits (Telemedicine).  Patients are able to view lab/test results, encounter notes, upcoming appointments, etc.  Non-urgent messages can be sent to your provider as well.   To learn more about what you can do with MyChart, go to [email or web address].    Your next appointment:   3 month(s)  The format for your next appointment:   In Person  Provider:   You may see Kristeen MissPhilip Aubri Gathright, MD or one of the following Advanced Practice Providers on your designated Care Team:    Tereso NewcomerScott Weaver, PA-C  Chelsea AusVin Bhagat, [place]PA-C   Other Instructions    [facility] [address], [address] [place] [address] Dept: [phone] Loc: [phone]  Amado CoeGerrard F Demmon  10/07/2019  You are scheduled for a Cardiac Catheterization on Monday, August 30 with Dr. Lance MussJayadeep Varanasi.  1. Please [facility] the [facility] at [facility]: [address] [place], [place] [address] at 7:00 AM (This time is two hours before your procedure to ensure your preparation). Free valet parking service is available.   Special note: Every effort  is made to have your procedure done on  time. Please understand that emergencies sometimes delay scheduled procedures.  2. Diet: Do not eat solid foods after midnight.  The patient may have clear liquids until 5am upon the day of the procedure.  3. Labs: You will need to have blood drawn on Tuesday, August 3 at [facility] at [facility]. [address], [place]  Open: 7:30am - 5pm    Phone: [phone]. You do not need to be fasting.  4. COVID Screening on Saturday, August 28th, 2021 at 11:00am at [address] [place], [place]  [address]  5. Medication instructions in preparation for your procedure:   Contrast Allergy: No   On the morning of your procedure, take your Aspirin and any morning medicines NOT listed above.  You may use sips of water.  5. Plan for one night stay--bring personal belongings. 6. Bring a current list of your medications and current insurance cards. 7. You MUST have a responsible person to drive you home. 8. Someone MUST be with you the first 24 hours after you arrive home or your discharge will be delayed. 9. Please wear clothes that are easy to get on and off and wear slip-on shoes.  Thank you for allowing [place] to care for you!   -- [facility] Invasive Cardiovascular services      Signed, Kristeen Miss, MD  10/07/2019 5:23 PM    [facility]

## 2019-10-07 ENCOUNTER — Encounter: Payer: Self-pay | Admitting: Cardiovascular Disease

## 2019-10-07 ENCOUNTER — Other Ambulatory Visit: Payer: Self-pay

## 2019-10-07 ENCOUNTER — Ambulatory Visit (INDEPENDENT_AMBULATORY_CARE_PROVIDER_SITE_OTHER): Payer: Managed Care, Other (non HMO) | Admitting: Cardiovascular Disease

## 2019-10-07 VITALS — BP 138/82 | HR 74 | Ht 72.0 in | Wt 214.2 lb

## 2019-10-07 DIAGNOSIS — R079 Chest pain, unspecified: Secondary | ICD-10-CM

## 2019-10-07 DIAGNOSIS — I209 Angina pectoris, unspecified: Secondary | ICD-10-CM | POA: Diagnosis not present

## 2019-10-07 LAB — LIPID PANEL
Chol/HDL Ratio: 3.5 ratio (ref 0.0–5.0)
Cholesterol, Total: 150 mg/dL (ref 100–199)
HDL: 43 mg/dL (ref >39)
LDL Chol Calc (NIH): 86 mg/dL (ref 0–99)
Triglycerides: 113 mg/dL (ref 0–149)
VLDL Cholesterol Cal: 21 mg/dL (ref 5–40)

## 2019-10-07 LAB — BASIC METABOLIC PANEL
BUN/Creatinine Ratio: 13 (ref 10–24)
BUN: 12 mg/dL (ref 8–27)
CO2: 25 mmol/L (ref 20–29)
Calcium: 9.8 mg/dL (ref 8.6–10.2)
Chloride: 103 mmol/L (ref 96–106)
Creatinine, Ser: 0.96 mg/dL (ref 0.76–1.27)
GFR calc Af Amer: 99 mL/min/{1.73_m2} (ref >59)
GFR calc non Af Amer: 86 mL/min/{1.73_m2} (ref >59)
Glucose: 102 mg/dL — ABNORMAL HIGH (ref 65–99)
Potassium: 4.7 mmol/L (ref 3.5–5.2)
Sodium: 142 mmol/L (ref 134–144)

## 2019-10-07 LAB — CBC
Hematocrit: 47.9 % (ref 37.5–51.0)
Hemoglobin: 16.5 g/dL (ref 13.0–17.7)
MCH: 31 pg (ref 26.6–33.0)
MCHC: 34.4 g/dL (ref 31.5–35.7)
MCV: 90 fL (ref 79–97)
Platelets: 231 10*3/uL (ref 150–450)
RBC: 5.33 x10E6/uL (ref 4.14–5.80)
RDW: 12.3 % (ref 11.6–15.4)
WBC: 3.7 10*3/uL (ref 3.4–10.8)

## 2019-10-07 NOTE — Patient Instructions (Addendum)
Medication Instructions:  Your physician recommends that you continue on your current medications as directed. Please refer to the Current Medication list given to you today.  *If you need a refill on your cardiac medications before your next appointment, please call your pharmacy*  Lab Work: TODAY: FLP, BMET, CBC  If you have labs (blood work) drawn today and your tests are completely normal, you will receive your results only by: Marland Kitchen MyChart Message (if you have MyChart) OR . A paper copy in the mail If you have any lab test that is abnormal or we need to change your treatment, we will call you to review the results.   Testing/Procedures: Your physician has requested that you have a cardiac catheterization. Cardiac catheterization is used to diagnose and/or treat various heart conditions. Doctors may recommend this procedure for a number of different reasons. The most common reason is to evaluate chest pain. Chest pain can be a symptom of coronary artery disease (CAD), and cardiac catheterization can show whether plaque is narrowing or blocking your heart's arteries. This procedure is also used to evaluate the valves, as well as measure the blood flow and oxygen levels in different parts of your heart. For further information please visit [email or web address] Please follow instruction sheet, as given.   Follow-Up: At [facility], you and your health needs are our priority.  As part of our continuing mission to provide you with exceptional heart care, we have created designated Provider Care Teams.  These Care Teams include your primary Cardiologist (physician) and Advanced Practice Providers (APPs -  Physician Assistants and Nurse Practitioners) who all work together to provide you with the care you need, when you need it.  We recommend signing up for the patient portal called "MyChart".  Sign up information is provided on this After Visit Summary.  MyChart is used to connect with patients for  Virtual Visits (Telemedicine).  Patients are able to view lab/test results, encounter notes, upcoming appointments, etc.  Non-urgent messages can be sent to your provider as well.   To learn more about what you can do with MyChart, go to [email or web address].    Your next appointment:   3 month(s)  The format for your next appointment:   In Person  Provider:   You may see Kristeen Miss, MD or one of the following Advanced Practice Providers on your designated Care Team:    Tereso Newcomer, PA-C  Chelsea Aus, [place]   Other Instructions    [facility] CARDIOVASCULAR DIVISION [facility] OFFICE [address] [place] [address] Dept: [phone] Loc: [phone]  ADANTE COURINGTON  10/07/2019  You are scheduled for a Cardiac Catheterization on Monday, August 30 with Dr. Lance Muss.  1. Please arrive at the [facility] (Main Entrance A) at [facility]: [address] [place], [place] [address] at 7:00 AM (This time is two hours before your procedure to ensure your preparation). Free valet parking service is available.   Special note: Every effort is made to have your procedure done on time. Please understand that emergencies sometimes delay scheduled procedures.  2. Diet: Do not eat solid foods after midnight.  The patient may have clear liquids until 5am upon the day of the procedure.  3. Labs: You will need to have blood drawn on Tuesday, August 3 at [facility] at [facility]. [address], [place]  Open: 7:30am - 5pm    Phone: [phone].  You do not need to be fasting.  4. COVID Screening on Saturday, August 28th, 2021 at 11:00am at [address] [place], [place]  [address]  5. Medication instructions in preparation for your procedure:   Contrast Allergy: No   On the morning of your procedure, take your Aspirin and any morning medicines NOT listed above.  You may use sips of  water.  5. Plan for one night stay--bring personal belongings. 6. Bring a current list of your medications and current insurance cards. 7. You MUST have a responsible person to drive you home. 8. Someone MUST be with you the first 24 hours after you arrive home or your discharge will be delayed. 9. Please wear clothes that are easy to get on and off and wear slip-on shoes.  Thank you for allowing [place] to care for you!   -- [place] Invasive Cardiovascular services

## 2019-10-08 ENCOUNTER — Telehealth: Payer: Self-pay | Admitting: Cardiovascular Disease

## 2019-10-08 NOTE — Telephone Encounter (Signed)
Patient called to advise that he went to lunch sooner than he thought he would, is at lunch now and will be done around 10:30am.

## 2019-10-08 NOTE — Telephone Encounter (Signed)
The patient has been notified of the result and verbalized understanding.  All questions (if any) were answered. Theresia Majors, RN 10/08/2019 10:32 AM

## 2019-10-08 NOTE — Telephone Encounter (Signed)
Patient is returning call to discuss results from lab work completed on 10/07/19. He states he is at work, however, he will be on break between 11:00 AM - 12:00 PM.

## 2019-10-30 ENCOUNTER — Telehealth: Payer: Self-pay | Admitting: *Deleted

## 2019-10-30 NOTE — Telephone Encounter (Signed)
Pt contacted pre-catheterization scheduled at [facility] for: Monday November 03, 2019 9 AM Verified arrival time and place: [facility] Main Entrance A [facility]) at: 7 AM   No solid food after midnight prior to cath, clear liquids until 5 AM day of procedure.   AM meds can be  taken pre-cath with sips of water including: ASA 81 mg   Confirmed patient has responsible adult to drive home post procedure and observe 24 hours after arriving home: yes  You are allowed ONE visitor in the waiting room during the time you are at the hospital for your procedure. Both you and your visitor must wear a mask once you enter the hospital.       COVID-19 Pre-Screening Questions:  . In the past 10 days have you had a new cough, shortness of breath, headache, congestion, fever (100 or greater) unexplained body aches, new sore throat, or sudden loss of taste or sense of smell? no . In the past 10 days have you been around anyone with known Covid 19? Pt states he has not been within 6 ft of someone with COVID for 15 minutes or more in 24- hour period.  . Have you been vaccinated for COVID-19? Pt declined to answer  Reviewed procedure/mask/visitor instructions, COVID-19 questions with patient.

## 2019-10-31 ENCOUNTER — Other Ambulatory Visit (HOSPITAL_COMMUNITY): Payer: Managed Care, Other (non HMO)

## 2019-11-01 ENCOUNTER — Inpatient Hospital Stay (HOSPITAL_COMMUNITY): Admission: RE | Admit: 2019-11-01 | Payer: Managed Care, Other (non HMO) | Source: Ambulatory Visit

## 2019-11-03 ENCOUNTER — Ambulatory Visit (HOSPITAL_COMMUNITY)
Admission: RE | Admit: 2019-11-03 | Discharge: 2019-11-03 | Disposition: A | Discharge status: Home / Self Care | Payer: Managed Care, Other (non HMO) | Attending: Interventional Cardiology | Admitting: Interventional Cardiology

## 2019-11-03 ENCOUNTER — Ambulatory Visit (HOSPITAL_COMMUNITY)
Admission: RE | Disposition: A | Discharge status: Home / Self Care | Payer: Self-pay | Source: Home / Self Care | Attending: Interventional Cardiology

## 2019-11-03 DIAGNOSIS — I200 Unstable angina: Secondary | ICD-10-CM | POA: Diagnosis present

## 2019-11-03 DIAGNOSIS — I10 Essential (primary) hypertension: Secondary | ICD-10-CM | POA: Diagnosis not present

## 2019-11-03 DIAGNOSIS — Z79899 Other long term (current) drug therapy: Secondary | ICD-10-CM | POA: Insufficient documentation

## 2019-11-03 DIAGNOSIS — I2510 Atherosclerotic heart disease of native coronary artery without angina pectoris: Secondary | ICD-10-CM

## 2019-11-03 DIAGNOSIS — E785 Hyperlipidemia, unspecified: Secondary | ICD-10-CM | POA: Diagnosis not present

## 2019-11-03 DIAGNOSIS — I2582 Chronic total occlusion of coronary artery: Secondary | ICD-10-CM | POA: Insufficient documentation

## 2019-11-03 DIAGNOSIS — Z20822 Contact with and (suspected) exposure to covid-19: Secondary | ICD-10-CM | POA: Insufficient documentation

## 2019-11-03 DIAGNOSIS — Z87891 Personal history of nicotine dependence: Secondary | ICD-10-CM | POA: Diagnosis not present

## 2019-11-03 DIAGNOSIS — Z7982 Long term (current) use of aspirin: Secondary | ICD-10-CM | POA: Diagnosis not present

## 2019-11-03 DIAGNOSIS — I25110 Atherosclerotic heart disease of native coronary artery with unstable angina pectoris: Secondary | ICD-10-CM | POA: Insufficient documentation

## 2019-11-03 DIAGNOSIS — Z8249 Family history of ischemic heart disease and other diseases of the circulatory system: Secondary | ICD-10-CM | POA: Insufficient documentation

## 2019-11-03 DIAGNOSIS — G510 Bell's palsy: Secondary | ICD-10-CM | POA: Diagnosis not present

## 2019-11-03 DIAGNOSIS — Z9861 Coronary angioplasty status: Secondary | ICD-10-CM

## 2019-11-03 DIAGNOSIS — Z955 Presence of coronary angioplasty implant and graft: Secondary | ICD-10-CM

## 2019-11-03 DIAGNOSIS — R079 Chest pain, unspecified: Secondary | ICD-10-CM

## 2019-11-03 HISTORY — PX: CORONARY STENT INTERVENTION: CATH118234

## 2019-11-03 HISTORY — PX: LEFT HEART CATH AND CORONARY ANGIOGRAPHY: CATH118249

## 2019-11-03 HISTORY — PX: INTRAVASCULAR ULTRASOUND/IVUS: CATH118244

## 2019-11-03 LAB — SARS CORONAVIRUS 2 BY RT PCR (HOSPITAL ORDER, PERFORMED IN ~~LOC~~ HOSPITAL LAB): SARS Coronavirus 2: NEGATIVE

## 2019-11-03 LAB — POCT ACTIVATED CLOTTING TIME
Activated Clotting Time: 345 seconds
Activated Clotting Time: 274 seconds
Activated Clotting Time: 290 seconds

## 2019-11-03 SURGERY — LEFT HEART CATH AND CORONARY ANGIOGRAPHY
Anesthesia: LOCAL

## 2019-11-03 MED ORDER — LABETALOL HCL 5 MG/ML IV SOLN: 10.0000 mg | INTRAVENOUS | Status: DC | PRN

## 2019-11-03 MED ORDER — SODIUM CHLORIDE 0.9% FLUSH: 3.0000 mL | INTRAVENOUS | Status: DC | PRN

## 2019-11-03 MED ORDER — ONDANSETRON HCL 4 MG/2ML IJ SOLN
INTRAMUSCULAR | Status: DC | PRN
  Administered 2019-11-03: 4 mg via INTRAVENOUS

## 2019-11-03 MED ORDER — SODIUM CHLORIDE 0.9% FLUSH: 3.0000 mL | Freq: Two times a day (BID) | INTRAVENOUS | Status: DC

## 2019-11-03 MED ORDER — ONDANSETRON HCL 4 MG/2ML IJ SOLN
INTRAMUSCULAR | Status: AC
  Filled 2019-11-03: qty 2

## 2019-11-03 MED ORDER — HEPARIN SODIUM (PORCINE) 1000 UNIT/ML IJ SOLN
INTRAMUSCULAR | Status: DC | PRN
  Administered 2019-11-03 (×2): 3000 [IU] via INTRAVENOUS
  Administered 2019-11-03 (×2): 5000 [IU] via INTRAVENOUS

## 2019-11-03 MED ORDER — SODIUM CHLORIDE 0.9 % WEIGHT BASED INFUSION
3.0000 mL/kg/h | INTRAVENOUS | Status: AC
  Administered 2019-11-03: 3 mL/kg/h via INTRAVENOUS
  Administered 2019-11-03: 999 mL/h via INTRAVENOUS

## 2019-11-03 MED ORDER — HEPARIN (PORCINE) IN NACL 1000-0.9 UT/500ML-% IV SOLN
INTRAVENOUS | Status: AC
  Filled 2019-11-03: qty 500

## 2019-11-03 MED ORDER — SODIUM CHLORIDE 0.9 % IV SOLN: 250.0000 mL | INTRAVENOUS | Status: DC | PRN

## 2019-11-03 MED ORDER — SODIUM CHLORIDE 0.9 % IV SOLN: INTRAVENOUS | Status: AC

## 2019-11-03 MED ORDER — MIDAZOLAM HCL 2 MG/2ML IJ SOLN
INTRAMUSCULAR | Status: AC
  Filled 2019-11-03: qty 2

## 2019-11-03 MED ORDER — FENTANYL CITRATE (PF) 100 MCG/2ML IJ SOLN
INTRAMUSCULAR | Status: AC
  Filled 2019-11-03: qty 2

## 2019-11-03 MED ORDER — ONDANSETRON HCL 4 MG/2ML IJ SOLN: 4.0000 mg | Freq: Four times a day (QID) | INTRAMUSCULAR | Status: DC | PRN

## 2019-11-03 MED ORDER — ASPIRIN 81 MG PO CHEW: 81.0000 mg | CHEWABLE_TABLET | ORAL | Status: DC

## 2019-11-03 MED ORDER — MIDAZOLAM HCL 2 MG/2ML IJ SOLN
INTRAMUSCULAR | Status: DC | PRN
  Administered 2019-11-03 (×2): 1 mg via INTRAVENOUS
  Administered 2019-11-03: 2 mg via INTRAVENOUS
  Administered 2019-11-03: 1 mg via INTRAVENOUS

## 2019-11-03 MED ORDER — NITROGLYCERIN 1 MG/10 ML FOR IR/CATH LAB
INTRA_ARTERIAL | Status: DC | PRN
  Administered 2019-11-03: 200 ug via INTRACORONARY
  Administered 2019-11-03: 200 ug via INTRA_ARTERIAL
  Administered 2019-11-03: 200 ug
  Administered 2019-11-03: 200 ug via INTRACORONARY

## 2019-11-03 MED ORDER — LIDOCAINE HCL (PF) 1 % IJ SOLN
INTRAMUSCULAR | Status: DC | PRN
  Administered 2019-11-03: 2 mL

## 2019-11-03 MED ORDER — TICAGRELOR 90 MG PO TABS
ORAL_TABLET | ORAL | Status: DC | PRN
  Administered 2019-11-03: 180 mg via ORAL

## 2019-11-03 MED ORDER — TICAGRELOR 90 MG PO TABS
ORAL_TABLET | ORAL | Status: AC
  Filled 2019-11-03: qty 1

## 2019-11-03 MED ORDER — HEPARIN SODIUM (PORCINE) 1000 UNIT/ML IJ SOLN
INTRAMUSCULAR | Status: AC
  Filled 2019-11-03: qty 1

## 2019-11-03 MED ORDER — LIDOCAINE HCL (PF) 1 % IJ SOLN
INTRAMUSCULAR | Status: AC
  Filled 2019-11-03: qty 30

## 2019-11-03 MED ORDER — HEPARIN (PORCINE) IN NACL 1000-0.9 UT/500ML-% IV SOLN
INTRAVENOUS | Status: DC | PRN
  Administered 2019-11-03: 500 mL

## 2019-11-03 MED ORDER — SODIUM CHLORIDE 0.9 % WEIGHT BASED INFUSION: 1.0000 mL/kg/h | INTRAVENOUS | Status: DC

## 2019-11-03 MED ORDER — IOHEXOL 350 MG/ML SOLN
INTRAVENOUS | Status: DC | PRN
  Administered 2019-11-03: 160 mL

## 2019-11-03 MED ORDER — VERAPAMIL HCL 2.5 MG/ML IV SOLN
INTRAVENOUS | Status: AC
  Filled 2019-11-03: qty 2

## 2019-11-03 MED ORDER — TICAGRELOR 90 MG PO TABS: 90.0000 mg | ORAL_TABLET | Freq: Two times a day (BID) | ORAL | Status: DC

## 2019-11-03 MED ORDER — VERAPAMIL HCL 2.5 MG/ML IV SOLN
INTRAVENOUS | Status: DC | PRN
  Administered 2019-11-03: 10 mL via INTRA_ARTERIAL

## 2019-11-03 MED ORDER — ACETAMINOPHEN 325 MG PO TABS: 650.0000 mg | ORAL_TABLET | ORAL | Status: DC | PRN

## 2019-11-03 MED ORDER — HYDRALAZINE HCL 20 MG/ML IJ SOLN: 10.0000 mg | INTRAMUSCULAR | Status: DC | PRN

## 2019-11-03 MED ORDER — NITROGLYCERIN 1 MG/10 ML FOR IR/CATH LAB
INTRA_ARTERIAL | Status: AC
  Filled 2019-11-03: qty 10

## 2019-11-03 MED ORDER — ASPIRIN 81 MG PO CHEW: 81.0000 mg | CHEWABLE_TABLET | Freq: Every day | ORAL | Status: DC

## 2019-11-03 MED ORDER — FENTANYL CITRATE (PF) 100 MCG/2ML IJ SOLN
INTRAMUSCULAR | Status: DC | PRN
Start: 2019-11-03 — End: 2019-11-03
  Administered 2019-11-03 (×2): 25 ug via INTRAVENOUS
  Administered 2019-11-03 (×2): 50 ug via INTRAVENOUS

## 2019-11-03 MED ORDER — TICAGRELOR 90 MG PO TABS: 90.0000 mg | ORAL_TABLET | Freq: Two times a day (BID) | ORAL | 11 refills | Status: DC

## 2019-11-03 MED FILL — BRILINTA 90 MG TABLET: 90 | 30 days supply | Qty: 60 | Fill #0

## 2019-11-03 SURGICAL SUPPLY — 23 items
BALLN SAPPHIRE 2.0X12 (BALLOONS) ×4
BALLN SAPPHIRE 2.5X15 (BALLOONS) ×2
BALLN SAPPHIRE ~~LOC~~ 3.5X18 (BALLOONS) ×1 IMPLANT
BALLOON SAPPHIRE 2.0X12 (BALLOONS) IMPLANT
BALLOON SAPPHIRE 2.5X15 (BALLOONS) IMPLANT
CATH 5FR JL3.5 JR4 ANG PIG MP (CATHETERS) ×1 IMPLANT
CATH LAUNCHER 6FR EBU 3.75 (CATHETERS) ×1 IMPLANT
CATH OPTICROSS HD (CATHETERS) ×1 IMPLANT
DEVICE RAD COMP TR BAND LRG (VASCULAR PRODUCTS) ×1 IMPLANT
GLIDESHEATH SLEND SS 6F .021 (SHEATH) ×1 IMPLANT
GUIDEWIRE INQWIRE 1.5J.035X260 (WIRE) IMPLANT
INQWIRE 1.5J .035X260CM (WIRE) ×2
KIT ENCORE 26 ADVANTAGE (KITS) ×1 IMPLANT
KIT HEART LEFT (KITS) ×2 IMPLANT
KIT HEMO VALVE WATCHDOG (MISCELLANEOUS) ×1 IMPLANT
PACK CARDIAC CATHETERIZATION (CUSTOM PROCEDURE TRAY) ×2 IMPLANT
SLED PULL BACK IVUS (MISCELLANEOUS) ×1 IMPLANT
STENT SYNERGY XD 2.75X48 (Permanent Stent) IMPLANT
SYNERGY XD 2.75X48 (Permanent Stent) ×2 IMPLANT
TRANSDUCER W/STOPCOCK (MISCELLANEOUS) ×2 IMPLANT
TUBING CIL FLEX 10 FLL-RA (TUBING) ×2 IMPLANT
WIRE ASAHI PROWATER 180CM (WIRE) ×1 IMPLANT
WIRE HI TORQ BMW 190CM (WIRE) ×1 IMPLANT

## 2019-11-03 NOTE — Interval H&P Note (Signed)
History and Physical Interval Note:  11/03/2019 2:05 PM  John [name]  has presented today for surgery, with the diagnosis of chest pain.  The various methods of treatment have been discussed with the patient and family. After consideration of risks, benefits and other options for treatment, the patient has consented to  Procedure(s): LEFT HEART CATH AND CORONARY ANGIOGRAPHY (N/A) CORONARY STENT INTERVENTION (N/A) Intravascular Ultrasound/IVUS (N/A) as a surgical intervention.  The patient's history has been reviewed, patient examined, no change in status, stable for surgery.  I have reviewed the patient's chart and labs.  Questions were answered to the patient's satisfaction.    Wrsening angina over time.Cath Lab Visit (complete for each Cath Lab visit)  Clinical Evaluation Leading to the Procedure:   ACS: No.  Non-ACS:    Anginal Classification: CCS III  Anti-ischemic medical therapy: Minimal Therapy (1 class of medications)  Non-Invasive Test Results: No non-invasive testing performed  Prior CABG: No previous CABG         Lance Muss

## 2019-11-03 NOTE — Progress Notes (Signed)
CARDIAC REHAB PHASE I   Stent education completed with pt. Pt educated on importance of ASA and Brilinta. Pt given stent card and heart healthy diet. Reviewed site care, restrictions, and exercise guidelines. Pt asking about return to work, Charity fundraiser made aware. Will refer to CRP II [place].  [phone] Reynold Bowen, RN BSN 11/03/2019 3:00 PM

## 2019-11-03 NOTE — Progress Notes (Signed)
Patient and wife was given discharge instructions. Both verbalized understanding. 

## 2019-11-03 NOTE — Discharge Summary (Addendum)
Discharge Summary for Same Day PCI   Patient ID: John [name] MRN: [id number]; DOB: 1960/03/08  Admit date: 11/03/2019 Discharge date: 11/03/2019  Primary Care Provider: Armando Gang, FNP  Primary Cardiologist: Kristeen Miss, MD   Discharge Diagnoses    Principal Problem:   Unstable angina [facility]) Active Problems:   CAD (coronary artery disease)   Hypertension   Hyperlipidemia LDL goal <70    Diagnostic Studies/Procedures    Cardiac Catheterization 11/03/2019:  CORONARY STENT INTERVENTION  Intravascular Ultrasound/IVUS  LEFT HEART CATH AND CORONARY ANGIOGRAPHY  Conclusion    2nd Diag lesion is 80% stenosed. THis could not be wired through the stent struts, or before stent placement due to the acute angle.  Mid LAD lesion is 100% stenosed. Right to left collaterals. This was a chronic total occlusion.  A drug-eluting stent was successfully placed across the CTO using a SYNERGY XD 2.75X48, postdilated to 3.5 mm proximally and optimized with IVUS.  Post intervention, there is a 0% residual stenosis. IVUS showed no evidence of edge dissection.  Ost LAD to Prox LAD lesion is 25% stenosed.  Prox RCA to Mid RCA lesion is 50% stenosed. Diffuse disease in the mid portion.  Dist LAD lesion is 25% stenosed.  The left ventricular systolic function is normal.  LV end diastolic pressure is normal.  The left ventricular ejection fraction is 55-65% by visual estimate.  There is no aortic valve stenosis.   Continue aggressive secondary prevention.  DAPT for 12 months.  Brilinta for at least 1 month.  Could switch to Plavix if there is an issue getting Brilinta.    If he has further angina, could re-evaluate the RCA with DFR, but I think his occluded LAD was the culprit.   Diagnostic Dominance: Right  Intervention      History of Present Illness     John [name] is a 59 y.o. male with hx of HTN and HLD who recently established care with Dr. Elease Hashimoto for  exertional chest discomfort. He missed his stress test appointment because his son passed away in August 24, 2022. Due to progression of symptoms cardiac catheterization was arranged for further evaluation.  Hospital Course    The patient underwent cardiac cath as noted above with 100% stenosed mLAD with right to left collateral (chronic occlusion) s/p PTCA and DES. IVUS showed no evidence of edge dissection. Could not wired through 80% stenosed 2nd dig. If he has further angina, could re-evaluate the RCA with DFR.   Plan for DAPT with ASA/Brillinta. IF cost is issue change Brilinta to Plavix after 1 month.  The patient was seen by cardiac rehab while in short stay. There were no observed complications post cath. Radial cath site was re-evaluated prior to discharge and found to be stable without any complications. Instructions/precautions regarding cath site care were given prior to discharge.  GERARDO CAIAZZO was seen by Dr. Eldridge Dace and determined stable for discharge home. Follow up with our office has been arranged. Medications are listed below. Pertinent changes include addition of Brilinta.   _____________  Cath/PCI Registry Performance & Quality Measures: 1. Aspirin prescribed? - Yes 2. ADP Receptor Inhibitor (Plavix/Clopidogrel, Brilinta/Ticagrelor or Effient/Prasugrel) prescribed (includes medically managed patients)? - Yes 3. High Intensity Statin (Lipitor 40-80mg  or Crestor 20-40mg ) prescribed? - No - Plan to change as outpatient  4. For EF <40%, was ACEI/ARB prescribed? - Yes 5. For EF <40%, Aldosterone Antagonist (Spironolactone or Eplerenone) prescribed? - Not Applicable (EF >/= 40%) 6. Cardiac Rehab Phase II  ordered (Included Medically managed Patients)? - Yes  _____________   Discharge Vitals Blood pressure 112/79, pulse (!) 57, temperature (!) 97.5 F (36.4 C), temperature source Oral, resp. rate 12, height 6' (1.829 m), weight 96.2 kg, SpO2 99 %.  Filed Weights   11/03/19 0708    Weight: 96.2 kg    Last Labs & Radiologic Studies   _____________  CARDIAC CATHETERIZATION  Addendum Date: 11/03/2019    2nd Diag lesion is 80% stenosed. THis could not be wired through the stent struts, or before stent placement due to the acute angle.  Mid LAD lesion is 100% stenosed. Right to left collaterals. This was a chronic total occlusion.  A drug-eluting stent was successfully placed across the CTO using a SYNERGY XD 2.75X48, postdilated to 3.5 mm proximally and optimized with IVUS.  Post intervention, there is a 0% residual stenosis. IVUS showed no evidence of edge dissection.  Ost LAD to Prox LAD lesion is 25% stenosed.  Prox RCA to Mid RCA lesion is 50% stenosed. Diffuse disease in the mid portion.  Dist LAD lesion is 25% stenosed.  The left ventricular systolic function is normal.  LV end diastolic pressure is normal.  The left ventricular ejection fraction is 55-65% by visual estimate.  There is no aortic valve stenosis.  Continue aggressive secondary prevention.  DAPT for 12 months.  Brilinta for at least 1 month.  Could switch to Plavix if there is an issue getting Brilinta.  If he has further angina, could re-evaluate the RCA with DFR, but I think his occluded LAD was the culprit.   Result Date: 11/03/2019  2nd Diag lesion is 80% stenosed. THis could not be wired through the stent struts, or before stent placement due to the acute angle.  Mid LAD lesion is 100% stenosed. Right to left collaterals.  A drug-eluting stent was successfully placed using a SYNERGY XD 2.75X48, postdilated to 3.5 mm proximally and optimized with IVUS.  Post intervention, there is a 0% residual stenosis. IVUS showed no evidence of edge dissection.  Ost LAD to Prox LAD lesion is 25% stenosed.  Prox RCA to Mid RCA lesion is 50% stenosed. Diffuse disease in the mid portion.  Dist LAD lesion is 25% stenosed.  The left ventricular systolic function is normal.  LV end diastolic pressure is normal.   The left ventricular ejection fraction is 55-65% by visual estimate.  There is no aortic valve stenosis.  Continue aggressive secondary prevention.  DAPT for 12 months.  Brilinta for at least 1 month.  Could switch to Plavix if there is an issue getting Brilinta.  If he has further angina, could re-evaluate the RCA with DFR, but I think his occluded LAD was the culprit.    Disposition   Pt is being discharged home today in good condition.  Follow-up Plans & Appointments     Discharge Instructions    Amb Referral to Cardiac Rehabilitation   Complete by: As directed    Diagnosis: Coronary Stents   After initial evaluation and assessments completed: Virtual Based Care may be provided alone or in conjunction with Phase 2 Cardiac Rehab based on patient barriers.: Yes       Discharge Medications   Allergies as of 11/03/2019   No Known Allergies     Medication List    TAKE these medications   artificial tears ointment Place into the left eye as needed.   aspirin EC 81 MG tablet Take 81 mg by mouth daily.   BLACK  CURRANT SEED OIL PO Take 1,250 mg by mouth daily.   cetirizine 10 MG tablet Commonly known as: ZYRTEC Take 10 mg by mouth daily.   dextran 70-hypromellose ophthalmic solution Place 1 drop into the left eye 4 (four) times daily as needed.   FLUoxetine 10 MG capsule Commonly known as: PROZAC Take 10 mg by mouth daily.   gabapentin 400 MG capsule Commonly known as: NEURONTIN Take 400 mg by mouth 2 (two) times daily as needed (pain).   ibuprofen 800 MG tablet Commonly known as: ADVIL Take 800 mg by mouth 3 (three) times daily as needed for moderate pain.   losartan 100 MG tablet Commonly known as: COZAAR Take 100 mg by mouth daily.   nitroGLYCERIN 0.3 MG SL tablet Commonly known as: NITROSTAT Place 1 tablet under the tongue every 5 (five) minutes as needed for chest pain.   OIL OF OREGANO PO Take 150 mg by mouth daily.   pravastatin 40 MG  tablet Commonly known as: PRAVACHOL Take 40 mg by mouth daily.   ticagrelor 90 MG Tabs tablet Commonly known as: BRILINTA Take 1 tablet (90 mg total) by mouth 2 (two) times daily.   Vitamin D-3 125 MCG (5000 UT) Tabs Take 1 tablet by mouth with breakfast, with lunch, and with evening meal.       Allergies No Known Allergies  Outstanding Labs/Studies   None  Duration of Discharge Encounter   Greater than 30 minutes including physician time.  SignedAzalee Course, PA 11/03/2019, 7:34 PM   I have examined the patient and reviewed assessment and plan and discussed with patient.  Agree with above as stated.    Right radial site stable.  Had some distal LAD spasm which improved with IC NTG.  Pain during procedure has improved. OK fir discharge.  Will plan for Brilinta for at least 1 month.  Could switch to clopidogrel after that time.  COntinue aggressive secondary prevention.    Lance Muss

## 2019-11-03 NOTE — Discharge Instructions (Signed)
Drink plenty of fluid for 48 hours and keep wrist elevated at heart level for 24 hours  Radial Site Care   This sheet gives you information about how to care for yourself after your procedure. Your health care provider may also give you more specific instructions. If you have problems or questions, contact your health care provider. What can I expect after the procedure? After the procedure, it is common to have:  Bruising and tenderness at the catheter insertion area. Follow these instructions at home: Medicines  Take over-the-counter and prescription medicines only as told by your health care provider. Insertion site care 1. Follow instructions from your health care provider about how to take care of your insertion site. Make sure you: ? Wash your hands with soap and water before you change your bandage (dressing). If soap and water are not available, use hand sanitizer. ? remove your dressing as told by your health care provider. In 24 hours 2. Check your insertion site every day for signs of infection. Check for: ? Redness, swelling, or pain. ? Fluid or blood. ? Pus or a bad smell. ? Warmth. 3. Do not take baths, swim, or use a hot tub until your health care provider approves. 4. You may shower 24-48 hours after the procedure, or as directed by your health care provider. ? Remove the dressing and gently wash the site with plain soap and water. ? Pat the area dry with a clean towel. ? Do not rub the site. That could cause bleeding. 5. Do not apply powder or lotion to the site. Activity   1. For 24 hours after the procedure, or as directed by your health care provider: ? Do not flex or bend the affected arm. ? Do not push or pull heavy objects with the affected arm. ? Do not drive yourself home from the hospital or clinic. You may drive 24 hours after the procedure unless your health care provider tells you not to. ? Do not operate machinery or power tools. 2. Do not lift  anything that is heavier than 10 lb (4.5 kg), or the limit that you are told, until your health care provider says that it is safe. For 4 days 3. Ask your health care provider when it is okay to: ? Return to work or school. ? Resume usual physical activities or sports. ? Resume sexual activity. General instructions  If the catheter site starts to bleed, raise your arm and put firm pressure on the site. If the bleeding does not stop, get help right away. This is a medical emergency.  If you went home on the same day as your procedure, a responsible adult should be with you for the first 24 hours after you arrive home.  Keep all follow-up visits as told by your health care provider. This is important. Contact a health care provider if:  You have a fever.  You have redness, swelling, or yellow drainage around your insertion site. Get help right away if:  You have unusual pain at the radial site.  The catheter insertion area swells very fast.  The insertion area is bleeding, and the bleeding does not stop when you hold steady pressure on the area.  Your arm or hand becomes pale, cool, tingly, or numb. These symptoms may represent a serious problem that is an emergency. Do not wait to see if the symptoms will go away. Get medical help right away. Call your local emergency services (911 in the [place] Do not   drive yourself to the hospital. Summary  After the procedure, it is common to have bruising and tenderness at the site.  Follow instructions from your health care provider about how to take care of your radial site wound. Check the wound every day for signs of infection.  Do not lift anything that is heavier than 10 lb (4.5 kg), or the limit that you are told, until your health care provider says that it is safe. This information is not intended to replace advice given to you by your health care provider. Make sure you discuss any questions you have with your health care  provider. Document Revised: 03/28/2017 Document Reviewed: 03/28/2017 Elsevier Patient Education  2020 Elsevier Inc[employer]  

## 2019-11-04 ENCOUNTER — Encounter (HOSPITAL_COMMUNITY): Payer: Self-pay | Admitting: Interventional Cardiology

## 2019-11-04 MED FILL — Heparin Sod (Porcine)-NaCl IV Soln 1000 Unit/500ML-0.9%: INTRAVENOUS | Qty: 500 | Status: AC

## 2019-11-06 ENCOUNTER — Telehealth (HOSPITAL_COMMUNITY): Payer: Self-pay

## 2019-11-13 ENCOUNTER — Other Ambulatory Visit: Payer: Self-pay

## 2019-11-13 ENCOUNTER — Encounter: Payer: Self-pay | Admitting: Physician Assistant

## 2019-11-13 ENCOUNTER — Ambulatory Visit (INDEPENDENT_AMBULATORY_CARE_PROVIDER_SITE_OTHER): Payer: Managed Care, Other (non HMO) | Admitting: Physician Assistant

## 2019-11-13 VITALS — BP 140/90 | HR 79 | Ht 72.0 in | Wt 215.2 lb

## 2019-11-13 DIAGNOSIS — I10 Essential (primary) hypertension: Secondary | ICD-10-CM | POA: Diagnosis not present

## 2019-11-13 DIAGNOSIS — I209 Angina pectoris, unspecified: Secondary | ICD-10-CM

## 2019-11-13 DIAGNOSIS — Z955 Presence of coronary angioplasty implant and graft: Secondary | ICD-10-CM | POA: Diagnosis not present

## 2019-11-13 DIAGNOSIS — E785 Hyperlipidemia, unspecified: Secondary | ICD-10-CM

## 2019-11-13 MED ORDER — ATORVASTATIN CALCIUM 80 MG PO TABS: 80.0000 mg | ORAL_TABLET | Freq: Every day | ORAL | 3 refills | Status: DC

## 2019-11-13 MED ORDER — CARVEDILOL 6.25 MG PO TABS: 6.2500 mg | ORAL_TABLET | Freq: Two times a day (BID) | ORAL | 3 refills | Status: DC

## 2019-11-13 NOTE — Progress Notes (Signed)
Cardiology Office Note    Date:  11/13/2019   ID:  John [name], DOB 03/11/1960, MRN [id number]  PCP:  Armando Gang, FNP  Cardiologist:  Dr. Elease Hashimoto  Chief Complaint: cath  follow up  History of Present Illness:   John [name] is a 59 y.o. male with hx of HTN, Bell's palsy in left face and HLD who recently established care with Dr. Elease Hashimoto for exertional chest discomfort. He missed his stress test appointment because his son passed away in 08-21-22. Due to progression of symptoms cardiac catheterization was arranged for further evaluation. Cath showed 100% stenosed mLAD with right to left collateral (chronic occlusion) s/p PTCA and DES. IVUS showed no evidence of edge dissection. Could not wired through 80% stenosed 2nd dig. If he has further angina, could re-evaluate the RCA with DFR.   Here today for follow up.  Patient reports gradual improved in symptoms since PCI.  He was able to load multiple 2 x 4 stud in his truck with recently.  Mild chest discomfort with heavy exertion  which is improving.  Symptoms not like previously.  "Deep breath" since PCI.  Does not seems like side effect of Brilinta on my evaluation.  No lower extremity edema, palpitation, orthopnea, PND or syncope.   Past Medical History:  Diagnosis Date  . Bell's palsy 07/31/2013  . Displacement of lumbar intervertebral disc without myelopathy   . High cholesterol   . Hypertension     Past Surgical History:  Procedure Laterality Date  . CORONARY STENT INTERVENTION N/A 11/03/2019   Procedure: CORONARY STENT INTERVENTION;  Surgeon: Corky Crafts, MD;  Location: [facility] INVASIVE CV LAB;  Service: Cardiovascular;  Laterality: N/A;  . INTRAVASCULAR ULTRASOUND/IVUS N/A 11/03/2019   Procedure: Intravascular Ultrasound/IVUS;  Surgeon: Corky Crafts, MD;  Location: [facility] INVASIVE CV LAB;  Service: Cardiovascular;  Laterality: N/A;  . LEFT HEART CATH AND CORONARY ANGIOGRAPHY N/A 11/03/2019   Procedure: LEFT HEART  CATH AND CORONARY ANGIOGRAPHY;  Surgeon: Corky Crafts, MD;  Location: [facility] INVASIVE CV LAB;  Service: Cardiovascular;  Laterality: N/A;  . WRIST SURGERY Right     Current Medications: Prior to Admission medications   Medication Sig Start Date End Date Taking? Authorizing Provider  Artificial Tear Ointment (ARTIFICIAL TEARS) ointment Place into the left eye as needed. Patient not taking: Reported on 10/27/2019 05/28/12   Mendel Ryder, MD  aspirin EC 81 MG tablet Take 81 mg by mouth daily.    [provider]  BLACK CURRANT SEED OIL PO Take 1,250 mg by mouth daily.    [provider]  cetirizine (ZYRTEC) 10 MG tablet Take 10 mg by mouth daily.     [provider]  Cholecalciferol (VITAMIN D-3) 125 MCG (5000 UT) TABS Take 1 tablet by mouth with breakfast, with lunch, and with evening meal.    [provider]  dextran 70-hypromellose (TEARS RENEWED) ophthalmic solution Place 1 drop into the left eye 4 (four) times daily as needed. Patient not taking: Reported on 10/27/2019 05/28/12   Mendel Ryder, MD  FLUoxetine (PROZAC) 10 MG capsule Take 10 mg by mouth daily.  08-21-2019   [provider]  gabapentin (NEURONTIN) 400 MG capsule Take 400 mg by mouth 2 (two) times daily as needed (pain).     [provider]  ibuprofen (ADVIL) 800 MG tablet Take 800 mg by mouth 3 (three) times daily as needed for moderate pain.  02/13/19   [provider]  losartan (COZAAR)  100 MG tablet Take 100 mg by mouth daily.    [provider]  nitroGLYCERIN (NITROSTAT) 0.3 MG SL tablet Place 1 tablet under the tongue every 5 (five) minutes as needed for chest pain.  02/25/19   [provider]  OIL OF OREGANO PO Take 150 mg by mouth daily.    [provider]  pravastatin (PRAVACHOL) 40 MG tablet Take 40 mg by mouth daily.    [provider]  ticagrelor (BRILINTA) 90 MG TABS tablet Take 1 tablet (90 mg total) by mouth 2  (two) times daily. 11/03/19   Manson Passey, PA    Allergies:   Patient has no known allergies.   Social History   Socioeconomic History  . Marital status: Divorced    Spouse name: Not on file  . Number of children: Not on file  . Years of education: Not on file  . Highest education level: Not on file  Occupational History  . Not on file  Tobacco Use  . Smoking status: Former Games developer  . Smokeless tobacco: Never Used  Substance and Sexual Activity  . Alcohol use: Yes    Comment: occasionally   . Drug use: No  . Sexual activity: Not on file  Other Topics Concern  . Not on file  Social History Narrative  . Not on file   Social Determinants of Health   Financial Resource Strain:   . Difficulty of Paying Living Expenses: Not on file  Food Insecurity:   . Worried About Programme researcher, broadcasting/film/video in the Last Year: Not on file  . Ran Out of Food in the Last Year: Not on file  Transportation Needs:   . Lack of Transportation (Medical): Not on file  . Lack of Transportation (Non-Medical): Not on file  Physical Activity:   . Days of Exercise per Week: Not on file  . Minutes of Exercise per Session: Not on file  Stress:   . Feeling of Stress : Not on file  Social Connections:   . Frequency of Communication with Friends and Family: Not on file  . Frequency of Social Gatherings with Friends and Family: Not on file  . Attends Religious Services: Not on file  . Active Member of Clubs or Organizations: Not on file  . Attends Banker Meetings: Not on file  . Marital Status: Not on file     Family History:  The patient's family history includes Diabetes in his unknown relative; Glaucoma in his unknown relative; High Cholesterol in his unknown relative; Hypertension in his unknown relative; Stroke in his unknown relative.   ROS:   Please see the history of present illness.    ROS All other systems reviewed and are negative.   PHYSICAL EXAM:   VS:  BP 140/90   Pulse  79   Ht 6' (1.829 m)   Wt 215 lb 3.2 oz (97.6 kg)   SpO2 96%   BMI 29.19 kg/m    GEN: Well nourished, well developed, in no acute distress  HEENT: normal  Neck: no JVD, carotid bruits, or masses Cardiac:RRR; no murmurs, rubs, or gallops,no edema  Respiratory:  clear to auscultation bilaterally, normal work of breathing GI: soft, nontender, nondistended, + BS MS: no deformity or atrophy  Skin: warm and dry, no rash Neuro:  Alert and Oriented x 3, Strength and sensation are intact Psych: euthymic mood, full affect  Wt Readings from Last 3 Encounters:  11/13/19 215 lb 3.2 oz (97.6 kg)  11/03/19  212 lb (96.2 kg)  10/07/19 214 lb 3.2 oz (97.2 kg)      Studies/Labs Reviewed:   EKG:  EKG is ordered today.  The ekg ordered today demonstrates normal sinus rhythm at rate of 79 bpm  Recent Labs: 10/07/2019: BUN 12; Creatinine, Ser 0.96; Hemoglobin 16.5; Platelets 231; Potassium 4.7; Sodium 142   Lipid Panel    Component Value Date/Time   CHOL 150 10/07/2019 1103   TRIG 113 10/07/2019 1103   HDL 43 10/07/2019 1103   CHOLHDL 3.5 10/07/2019 1103   LDLCALC 86 10/07/2019 1103    Additional studies/ records that were reviewed today include:   CORONARY STENT INTERVENTION  Intravascular Ultrasound/IVUS  LEFT HEART CATH AND CORONARY ANGIOGRAPHY  Conclusion    2nd Diag lesion is 80% stenosed. THis could not be wired through the stent struts, or before stent placement due to the acute angle.  Mid LAD lesion is 100% stenosed. Right to left collaterals. This was a chronic total occlusion.  A drug-eluting stent was successfully placed across the CTO using a SYNERGY XD 2.75X48, postdilated to 3.5 mm proximally and optimized with IVUS.  Post intervention, there is a 0% residual stenosis. IVUS showed no evidence of edge dissection.  Ost LAD to Prox LAD lesion is 25% stenosed.  Prox RCA to Mid RCA lesion is 50% stenosed. Diffuse disease in the mid portion.  Dist LAD lesion is 25%  stenosed.  The left ventricular systolic function is normal.  LV end diastolic pressure is normal.  The left ventricular ejection fraction is 55-65% by visual estimate.  There is no aortic valve stenosis.   Continue aggressive secondary prevention.  DAPT for 12 months.  Brilinta for at least 1 month.  Could switch to Plavix if there is an issue getting Brilinta.    If he has further angina, could re-evaluate the RCA with DFR, but I think his occluded LAD was the culprit.  Diagnostic Dominance: Right  Intervention       ASSESSMENT & PLAN:   1.  CAD s/p DES to mid LAD with right-to-left collaterals -Unable to open up diagonal lesion.  Seems patient has mixed typical and atypical symptoms but overall improving since his PCI.  He is able to do exertional activity without any problem.  Add Coreg for high blood pressure and antianginal benefit.  Continue aspirin, Brilinta, statin, and losartan  2.  Hypertension  -Patient reports blood pressure in 140 systolically recently -Continue losartan 100 mg daily -Add Coreg  3.  Hyperlipidemia -Change pravastatin to high intensity statin - 10/07/2019: Cholesterol, Total 150; HDL 43; LDL Chol Calc (NIH) 86; Triglycerides 113  -Repeat labs at follow-up.   Medication Adjustments/Labs and Tests Ordered: Current medicines are reviewed at length with the patient today.  Concerns regarding medicines are outlined above.  Medication changes, Labs and Tests ordered today are listed in the Patient Instructions below. Patient Instructions  Medication Instructions:  Your physician recommends that you continue on your current medications as directed. Please refer to the Current Medication list given to you today.  *If you need a refill on your cardiac medications before your next appointment, please call your pharmacy*   Lab Work: None ordered  If you have labs (blood work) drawn today and your tests are completely normal, you will receive your  results only by: Marland Kitchen. MyChart Message (if you have MyChart) OR . A paper copy in the mail If you have any lab test that is abnormal or we need to change  your treatment, we will call you to review the results.   Testing/Procedures: None ordered   Follow-Up: At [facility], you and your health needs are our priority.  As part of our continuing mission to provide you with exceptional heart care, we have created designated Provider Care Teams.  These Care Teams include your primary Cardiologist (physician) and Advanced Practice Providers (APPs -  Physician Assistants and Nurse Practitioners) who all work together to provide you with the care you need, when you need it.  We recommend signing up for the patient portal called "MyChart".  Sign up information is provided on this After Visit Summary.  MyChart is used to connect with patients for Virtual Visits (Telemedicine).  Patients are able to view lab/test results, encounter notes, upcoming appointments, etc.  Non-urgent messages can be sent to your provider as well.   To learn more about what you can do with MyChart, go to [email or web address].    Your next appointment:   6 month(s)  The format for your next appointment:   In Person  Provider:   You may see Kristeen Miss, MD or one of the following Advanced Practice Providers on your designated Care Team:    Tereso Newcomer, PA-C  Chelsea Aus, PA-C    Other Instructions      Signed, Manson Passey, [place]  11/13/2019 2:56 PM    [facility] [address] [place], [place], [place]  [address] Phone: [phone]; Fax: [phone]

## 2019-11-13 NOTE — Patient Instructions (Addendum)
Medication Instructions:  Your physician has recommended you make the following change in your medication:  1.  STOP Pravastatin 2.  START Atorvastatin 80 mg taking 1 daily 3.  START Carvedilol 6.25 taking 1 tablet twice a day   *If you need a refill on your cardiac medications before your next appointment, please call your pharmacy*   Lab Work: None ordered  If you have labs (blood work) drawn today and your tests are completely normal, you will receive your results only by: Marland Kitchen MyChart Message (if you have MyChart) OR . A paper copy in the mail If you have any lab test that is abnormal or we need to change your treatment, we will call you to review the results.   Testing/Procedures: None ordered   Follow-Up: At [facility], you and your health needs are our priority.  As part of our continuing mission to provide you with exceptional heart care, we have created designated Provider Care Teams.  These Care Teams include your primary Cardiologist (physician) and Advanced Practice Providers (APPs -  Physician Assistants and Nurse Practitioners) who all work together to provide you with the care you need, when you need it.  We recommend signing up for the patient portal called "MyChart".  Sign up information is provided on this After Visit Summary.  MyChart is used to connect with patients for Virtual Visits (Telemedicine).  Patients are able to view lab/test results, encounter notes, upcoming appointments, etc.  Non-urgent messages can be sent to your provider as well.   To learn more about what you can do with MyChart, go to [email or web address].    Your next appointment:   3 month(s)   Keep your appointment with Dr. Elease Hashimoto in November  The format for your next appointment:   In Person  Provider:   You may see Kristeen Miss, MD or one of the following Advanced Practice Providers on your designated Care Team:    Tereso Newcomer, PA-C  Chelsea Aus, [place]    Other  Instructions

## 2019-11-18 ENCOUNTER — Encounter (HOSPITAL_COMMUNITY): Payer: Managed Care, Other (non HMO)

## 2019-11-18 ENCOUNTER — Telehealth: Payer: Self-pay | Admitting: Cardiovascular Disease

## 2019-11-18 NOTE — Telephone Encounter (Signed)
Forms from [employer] received on 11/14/19. Completed patient auth attached. Took form to Dr. Harvie Bridge box for completion. 11/18/19 vlm

## 2019-11-19 ENCOUNTER — Telehealth: Payer: Self-pay | Admitting: Cardiovascular Disease

## 2019-11-19 NOTE — Telephone Encounter (Signed)
Spoke with patient and confirmed dates for paperwork. Completed forms signed by Dr. Elease Hashimoto and given to Medical Records.

## 2019-11-19 NOTE — Telephone Encounter (Signed)
Form completed and faxed to [employer] on 11/19/19. 11/19/19 vlm

## 2020-01-12 ENCOUNTER — Encounter: Payer: Self-pay | Admitting: Cardiovascular Disease

## 2020-01-12 NOTE — Progress Notes (Signed)
Cardiology Office Note:    Date:  01/13/2020   ID:  John [name], DOB [birth year], MRN [id number]  PCP:  John Gang, FNP  Cardiologist:  John [name]  Electrophysiologist:  None   Referring MD: John Gang, FNP   Chief Complaint  Patient presents with  . Coronary Artery Disease    Previous notes:      John [name] is a 59 y.o. male with a hx of hypertension and hyperlipidemia.  He is seen today for further evaluation of some exertional chest discomfort.  We are asked to see him today by John [name]  for further evaluation of this exertional chest pain.  Started having exertional CP while push mowing his yard in 2019. Would have to stop and rest. Later , he noticed CP while climbing the [place].  CP was achey,  Would last several minutes.  Would have to stop and relax .  Associated with sweats and dyspnea.   No dizziness  Is having to limit his exercise because of this cp .   Was in the hospital at [facility] with cp.   Does not recall whether or not he had a stress test.    October 07, 2019:  John [name] is seen today for follow-up visit.  We saw him several months ago he was having episodes of chest discomfort.  We discussed heart catheterization and also discussed stress testing.  We schedule him for stress test but he never test done.  His sone passed away in 08-19-22 and did not make it to the stress appt.  Still has some CP.  Occurs with heavy lifting and stress.  Pains last for a minute or so .  He has been having exertional chest pain for the past year or so.  They seem to be progressing.  January 13, 2020: John [name] is seen back today for follow-up of his coronary artery disease.  He status post stenting of his mid LAD.  There was a second diagonal stenosis that could not be wired because of the previously placed LAD stent.  CP is much better,  Has some doe on occasion .   Past Medical History:  Diagnosis Date  . Bell's palsy 07/31/2013  .  Displacement of lumbar intervertebral disc without myelopathy   . High cholesterol   . Hypertension     Past Surgical History:  Procedure Laterality Date  . CORONARY STENT INTERVENTION N/A 11/03/2019   Procedure: CORONARY STENT INTERVENTION;  Surgeon: John Crafts, MD;  Location: [facility] INVASIVE CV LAB;  Service: Cardiovascular;  Laterality: N/A;  . INTRAVASCULAR ULTRASOUND/IVUS N/A 11/03/2019   Procedure: Intravascular Ultrasound/IVUS;  Surgeon: John Crafts, MD;  Location: [facility] INVASIVE CV LAB;  Service: Cardiovascular;  Laterality: N/A;  . LEFT HEART CATH AND CORONARY ANGIOGRAPHY N/A 11/03/2019   Procedure: LEFT HEART CATH AND CORONARY ANGIOGRAPHY;  Surgeon: John Crafts, MD;  Location: [facility] INVASIVE CV LAB;  Service: Cardiovascular;  Laterality: N/A;  . WRIST SURGERY Right     Current Medications: Current Meds  Medication Sig  . aspirin EC 81 MG tablet Take 81 mg by mouth daily.  Marland Kitchen atorvastatin (LIPITOR) 80 MG tablet Take 1 tablet (80 mg total) by mouth daily.  Marland Kitchen BLACK CURRANT SEED OIL PO Take 1,250 mg by mouth daily.  . carvedilol (COREG) 6.25 MG tablet Take 1 tablet (6.25 mg total) by mouth 2 (two) times daily.  . cetirizine (ZYRTEC) 10 MG tablet Take 10 mg by mouth daily.   Marland Kitchen  Cholecalciferol (VITAMIN D-3) 125 MCG (5000 UT) TABS Take 1 tablet by mouth with breakfast, with lunch, and with evening meal.  . FLUoxetine (PROZAC) 10 MG capsule Take 10 mg by mouth daily.   Marland Kitchen gabapentin (NEURONTIN) 400 MG capsule Take 400 mg by mouth 2 (two) times daily as needed (pain).   Marland Kitchen ibuprofen (ADVIL) 800 MG tablet Take 800 mg by mouth 3 (three) times daily as needed for moderate pain.   Marland Kitchen losartan (COZAAR) 100 MG tablet Take 100 mg by mouth daily.  . nitroGLYCERIN (NITROSTAT) 0.3 MG SL tablet Place 1 tablet under the tongue every 5 (five) minutes as needed for chest pain.   . OIL OF OREGANO PO Take 150 mg by mouth daily.  . ticagrelor (BRILINTA) 90 MG TABS tablet Take 1 tablet (90  mg total) by mouth 2 (two) times daily.     Allergies:   Patient has no known allergies.   Social History   Socioeconomic History  . Marital status: Married    Spouse name: Not on file  . Number of children: Not on file  . Years of education: Not on file  . Highest education level: Not on file  Occupational History  . Not on file  Tobacco Use  . Smoking status: Former Games developer  . Smokeless tobacco: Never Used  Substance and Sexual Activity  . Alcohol use: Yes    Comment: occasionally   . Drug use: No  . Sexual activity: Not on file  Other Topics Concern  . Not on file  Social History Narrative  . Not on file   Social Determinants of Health   Financial Resource Strain:   . Difficulty of Paying Living Expenses: Not on file  Food Insecurity:   . Worried About Programme researcher, broadcasting/film/video in the Last Year: Not on file  . Ran Out of Food in the Last Year: Not on file  Transportation Needs:   . Lack of Transportation (Medical): Not on file  . Lack of Transportation (Non-Medical): Not on file  Physical Activity:   . Days of Exercise per Week: Not on file  . Minutes of Exercise per Session: Not on file  Stress:   . Feeling of Stress : Not on file  Social Connections:   . Frequency of Communication with Friends and Family: Not on file  . Frequency of Social Gatherings with Friends and Family: Not on file  . Attends Religious Services: Not on file  . Active Member of Clubs or Organizations: Not on file  . Attends Banker Meetings: Not on file  . Marital Status: Not on file     Family History: The patient's family history includes Diabetes in his unknown relative; Glaucoma in his unknown relative; High Cholesterol in his unknown relative; Hypertension in his unknown relative; Stroke in his unknown relative.  ROS:   Please see the history of present illness.     All other systems reviewed and are negative.  EKGs/Labs/Other Studies Reviewed:    The following studies  were reviewed today:     Recent Labs: 10/07/2019: BUN 12; Creatinine, Ser 0.96; Hemoglobin 16.5; Platelets 231; Potassium 4.7; Sodium 142  Recent Lipid Panel    Component Value Date/Time   CHOL 150 10/07/2019 1103   TRIG 113 10/07/2019 1103   HDL 43 10/07/2019 1103   CHOLHDL 3.5 10/07/2019 1103   LDLCALC 86 10/07/2019 1103    Physical Exam:     Physical Exam: Blood pressure 122/68, pulse 70, height  6' (1.829 m), weight 219 lb 8 oz (99.6 kg), SpO2 97 %.  GEN:  Well nourished, well developed in no acute distress HEENT: Normal NECK: No JVD; No carotid bruits LYMPHATICS: No lymphadenopathy CARDIAC: RRR , no murmurs, rubs, gallops RESPIRATORY:  Clear to auscultation without rales, wheezing or rhonchi  ABDOMEN: Soft, non-tender, non-distended MUSCULOSKELETAL:  No edema; No deformity , right radial cath site looks good.  Pulses are normal  SKIN: Warm and dry NEUROLOGIC:  Alert and oriented x 3   EKG:        ASSESSMENT:    1. S/P coronary artery stent placement   2. Essential hypertension   3. Hyperlipidemia LDL goal <70   4. Coronary artery disease involving native coronary artery of native heart without angina pectoris      PLAN:    In order of problems listed above:  1.  CAD - doing well  , no further angina   , status post stenting of his LAD.  He had some snowplowing of plaque down into one of the diagonal vessels.  2.  Hyperlipidemia:   He was originally on pravastatin.  His age his LDL was 4286.  We changed him to atorvastatin 80 mg a day.  We will check lipids, liver enzymes, basic metabolic profile today.   Medication Adjustments/Labs and Tests Ordered: Current medicines are reviewed at length with the patient today.  Concerns regarding medicines are outlined above.  Orders Placed This Encounter  Procedures  . Lipid Profile  . Basic Metabolic Panel (BMET)  . Hepatic function panel   No orders of the defined types were placed in this  encounter.    Patient Instructions  Medication Instructions:  Your physician recommends that you continue on your current medications as directed. Please refer to the Current Medication list given to you today.  *If you need a refill on your cardiac medications before your next appointment, please call your pharmacy*   Lab Work: TODAY - cholesterol, liver panel, basic metabolic panel If you have labs (blood work) drawn today and your tests are completely normal, you will receive your results only by: Marland Kitchen. MyChart Message (if you have MyChart) OR . A paper copy in the mail If you have any lab test that is abnormal or we need to change your treatment, we will call you to review the results.   Testing/Procedures: None Ordered   Follow-Up: At [facility], you and your health needs are our priority.  As part of our continuing mission to provide you with exceptional heart care, we have created designated Provider Care Teams.  These Care Teams include your primary Cardiologist (physician) and Advanced Practice Providers (APPs -  Physician Assistants and Nurse Practitioners) who all work together to provide you with the care you need, when you need it.  We recommend signing up for the patient portal called "MyChart".  Sign up information is provided on this After Visit Summary.  MyChart is used to connect with patients for Virtual Visits (Telemedicine).  Patients are able to view lab/test results, encounter notes, upcoming appointments, etc.  Non-urgent messages can be sent to your provider as well.   To learn more about what you can do with MyChart, go to [email or web address].    Your next appointment:   6 month(s)  The format for your next appointment:   In Person  Provider:   You will see one of the following Advanced Practice Providers on your designated Care Team:    Tereso NewcomerScott Weaver,  PA-C  Chelsea Aus, PA-C        Signed, Kristeen Miss, MD  01/13/2020 1:34 PM    [facility]

## 2020-01-13 ENCOUNTER — Ambulatory Visit (INDEPENDENT_AMBULATORY_CARE_PROVIDER_SITE_OTHER): Payer: Managed Care, Other (non HMO) | Admitting: Cardiovascular Disease

## 2020-01-13 ENCOUNTER — Other Ambulatory Visit: Payer: Self-pay

## 2020-01-13 ENCOUNTER — Encounter: Payer: Self-pay | Admitting: Cardiovascular Disease

## 2020-01-13 VITALS — BP 122/68 | HR 70 | Ht 72.0 in | Wt 219.5 lb

## 2020-01-13 DIAGNOSIS — I10 Essential (primary) hypertension: Secondary | ICD-10-CM

## 2020-01-13 DIAGNOSIS — E785 Hyperlipidemia, unspecified: Secondary | ICD-10-CM

## 2020-01-13 DIAGNOSIS — I2510 Atherosclerotic heart disease of native coronary artery without angina pectoris: Secondary | ICD-10-CM | POA: Diagnosis not present

## 2020-01-13 DIAGNOSIS — Z955 Presence of coronary angioplasty implant and graft: Secondary | ICD-10-CM | POA: Diagnosis not present

## 2020-01-13 LAB — BASIC METABOLIC PANEL
BUN/Creatinine Ratio: 9 — ABNORMAL LOW (ref 10–24)
BUN: 8 mg/dL (ref 8–27)
CO2: 25 mmol/L (ref 20–29)
Calcium: 9.5 mg/dL (ref 8.6–10.2)
Chloride: 103 mmol/L (ref 96–106)
Creatinine, Ser: 0.94 mg/dL (ref 0.76–1.27)
GFR calc Af Amer: 101 mL/min/{1.73_m2} (ref >59)
GFR calc non Af Amer: 88 mL/min/{1.73_m2} (ref >59)
Glucose: 120 mg/dL — ABNORMAL HIGH (ref 65–99)
Potassium: 4.3 mmol/L (ref 3.5–5.2)
Sodium: 141 mmol/L (ref 134–144)

## 2020-01-13 LAB — HEPATIC FUNCTION PANEL
ALT: 29 IU/L (ref 0–44)
AST: 27 IU/L (ref 0–40)
Albumin: 4.5 g/dL (ref 3.8–4.9)
Alkaline Phosphatase: 74 IU/L (ref 44–121)
Bilirubin Total: 0.4 mg/dL (ref 0.0–1.2)
Bilirubin, Direct: 0.13 mg/dL (ref 0.00–0.40)
Total Protein: 6.7 g/dL (ref 6.0–8.5)

## 2020-01-13 LAB — LIPID PANEL
Chol/HDL Ratio: 2.7 ratio (ref 0.0–5.0)
Cholesterol, Total: 114 mg/dL (ref 100–199)
HDL: 43 mg/dL (ref >39)
LDL Chol Calc (NIH): 52 mg/dL (ref 0–99)
Triglycerides: 102 mg/dL (ref 0–149)
VLDL Cholesterol Cal: 19 mg/dL (ref 5–40)

## 2020-01-13 NOTE — Patient Instructions (Signed)
Medication Instructions:  Your physician recommends that you continue on your current medications as directed. Please refer to the Current Medication list given to you today.  *If you need a refill on your cardiac medications before your next appointment, please call your pharmacy*   Lab Work: TODAY - cholesterol, liver panel, basic metabolic panel If you have labs (blood work) drawn today and your tests are completely normal, you will receive your results only by: . MyChart Message (if you have MyChart) OR . A paper copy in the mail If you have any lab test that is abnormal or we need to change your treatment, we will call you to review the results.   Testing/Procedures: None Ordered   Follow-Up: At [facility], you and your health needs are our priority.  As part of our continuing mission to provide you with exceptional heart care, we have created designated Provider Care Teams.  These Care Teams include your primary Cardiologist (physician) and Advanced Practice Providers (APPs -  Physician Assistants and Nurse Practitioners) who all work together to provide you with the care you need, when you need it.  We recommend signing up for the patient portal called "MyChart".  Sign up information is provided on this After Visit Summary.  MyChart is used to connect with patients for Virtual Visits (Telemedicine).  Patients are able to view lab/test results, encounter notes, upcoming appointments, etc.  Non-urgent messages can be sent to your provider as well.   To learn more about what you can do with MyChart, go to [email or web address].    Your next appointment:   6 month(s)  The format for your next appointment:   In Person  Provider:   You will see one of the following Advanced Practice Providers on your designated Care Team:    Scott Weaver, PA-C  Vin Bhagat, PA-C    

## 2020-03-26 ENCOUNTER — Ambulatory Visit (INDEPENDENT_AMBULATORY_CARE_PROVIDER_SITE_OTHER): Payer: Managed Care, Other (non HMO) | Admitting: Cardiovascular Disease

## 2020-03-26 ENCOUNTER — Other Ambulatory Visit: Payer: Self-pay

## 2020-03-26 ENCOUNTER — Encounter: Payer: Self-pay | Admitting: Cardiovascular Disease

## 2020-03-26 VITALS — BP 124/70 | HR 86 | Ht 72.0 in | Wt 208.8 lb

## 2020-03-26 DIAGNOSIS — I6529 Occlusion and stenosis of unspecified carotid artery: Secondary | ICD-10-CM | POA: Diagnosis not present

## 2020-03-26 DIAGNOSIS — I25111 Atherosclerotic heart disease of native coronary artery with angina pectoris with documented spasm: Secondary | ICD-10-CM | POA: Diagnosis not present

## 2020-03-26 NOTE — Patient Instructions (Signed)
Medication Instructions:  The current medical regimen is effective;  continue present plan and medications.  *If you need a refill on your cardiac medications before your next appointment, please call your pharmacy*  Testing/Procedures: Your physician has requested that you have a carotid duplex. This test is an ultrasound of the carotid arteries in your neck. It looks at blood flow through these arteries that supply the brain with blood. Allow one hour for this exam. There are no restrictions or special instructions.  Follow-Up: At [facility], you and your health needs are our priority.  As part of our continuing mission to provide you with exceptional heart care, we have created designated Provider Care Teams.  These Care Teams include your primary Cardiologist (physician) and Advanced Practice Providers (APPs -  Physician Assistants and Nurse Practitioners) who all work together to provide you with the care you need, when you need it.  We recommend signing up for the patient portal called "MyChart".  Sign up information is provided on this After Visit Summary.  MyChart is used to connect with patients for Virtual Visits (Telemedicine).  Patients are able to view lab/test results, encounter notes, upcoming appointments, etc.  Non-urgent messages can be sent to your provider as well.   To learn more about what you can do with MyChart, go to [email or web address].    Your next appointment:   Please keep previous appointment as scheduled.  Thank you for choosing [facility]!!

## 2020-03-26 NOTE — Progress Notes (Signed)
Cardiology Office Note:    Date:  03/26/2020   ID:  John [name], DOB Jul 16, 1960, MRN [id number]  PCP:  Armando Gang, FNP  Cardiologist:  Honest Safranek  Electrophysiologist:  None   Referring MD: Armando Gang, FNP   Chief Complaint  Patient presents with  . Coronary Artery Disease    Previous notes:      John [name] [name] a 60 y.o. male with a hx of hypertension and hyperlipidemia.  He [name] seen today for further evaluation of some exertional chest discomfort.  We are asked to see him today by Dr. Welton Flakes  for further evaluation of this exertional chest pain.  Started having exertional CP while push mowing his yard in 2019. Would have to stop and rest. Later , he noticed CP while climbing the [place].  CP was achey,  Would last several minutes.  Would have to stop and relax .  Associated with sweats and dyspnea.   No dizziness  [name] having to limit his exercise because of this cp .   Was in the hospital at [facility] with cp.   Does not recall whether or not he had a stress test.    October 07, 2019:  John [name] [name] seen today for follow-up visit.  We saw him several months ago he was having episodes of chest discomfort.  We discussed heart catheterization and also discussed stress testing.  We schedule him for stress test but he never test done.  His sone passed away in September 20, 2022 and did not make it to the stress appt.  Still has some CP.  Occurs with heavy lifting and stress.  Pains last for a minute or so .  He has been having exertional chest pain for the past year or so.  They seem to be progressing.  January 13, 2020: John [name] seen back today for follow-up of his coronary artery disease.  He status post stenting of his mid LAD.  There was a second diagonal stenosis that could not be wired because of the previously placed LAD stent.  CP [name] much better,  Has some doe on occasion .   Jan. 21, 2022: John [name] [name] seen today for work in visit.  He was recently at the  dentist and was noted to have carotid calcifications on xray . 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 Bell's palsy 07/31/2013  . Displacement of lumbar intervertebral disc without myelopathy   . High cholesterol   . Hypertension     Past Surgical History:  Procedure Laterality Date  . CORONARY STENT INTERVENTION N/A 11/03/2019   Procedure: CORONARY STENT INTERVENTION;  Surgeon: Corky Crafts, MD;  Location: [facility] INVASIVE CV LAB;  Service: Cardiovascular;  Laterality: N/A;  . INTRAVASCULAR ULTRASOUND/IVUS N/A 11/03/2019   Procedure: Intravascular Ultrasound/IVUS;  Surgeon: Corky Crafts, MD;  Location: [facility] INVASIVE CV LAB;  Service: Cardiovascular;  Laterality: N/A;  . LEFT HEART CATH AND CORONARY ANGIOGRAPHY N/A 11/03/2019   Procedure: LEFT HEART CATH AND CORONARY ANGIOGRAPHY;  Surgeon: Corky Crafts, MD;  Location: [facility] INVASIVE CV LAB;  Service: Cardiovascular;  Laterality: N/A;  . WRIST SURGERY Right     Current Medications: Current Meds  Medication Sig  . aspirin EC 81 MG tablet Take 81 mg by mouth daily.  Marland Kitchen atorvastatin (LIPITOR) 80 MG  tablet Take 1 tablet (80 mg total) by mouth daily.  Marland Kitchen BLACK CURRANT SEED OIL PO Take 1,250 mg by mouth daily.  . carvedilol (COREG) 6.25 MG tablet Take 1 tablet (6.25 mg total) by mouth 2 (two) times daily.  . cetirizine (ZYRTEC) 10 MG tablet Take 10 mg by mouth daily.  . Cholecalciferol (VITAMIN D-3) 125 MCG (5000 UT) TABS Take 1 tablet by mouth with breakfast, with lunch, and with evening meal.  . FLUoxetine (PROZAC) 10 MG capsule Take 10 mg by mouth daily.   Marland Kitchen gabapentin (NEURONTIN) 400 MG capsule Take 400 mg by mouth 2 (two) times daily as needed (pain).   Marland Kitchen ibuprofen (ADVIL) 800 MG tablet Take 800 mg by mouth 3 (three) times daily as needed for moderate pain.   Marland Kitchen losartan (COZAAR) 100 MG tablet  Take 100 mg by mouth daily.  . nitroGLYCERIN (NITROSTAT) 0.3 MG SL tablet Place 1 tablet under the tongue every 5 (five) minutes as needed for chest pain.   . OIL OF OREGANO PO Take 150 mg by mouth daily.  . ticagrelor (BRILINTA) 90 MG TABS tablet Take 1 tablet (90 mg total) by mouth 2 (two) times daily.     Allergies:   Patient has no known allergies.   Social History   Socioeconomic History  . Marital status: Married    Spouse name: Not on file  . Number of children: Not on file  . Years of education: Not on file  . Highest education level: Not on file  Occupational History  . Not on file  Tobacco Use  . Smoking status: Former Games developer  . Smokeless tobacco: Never Used  Substance and Sexual Activity  . Alcohol use: Yes    Comment: occasionally   . Drug use: No  . Sexual activity: Not on file  Other Topics Concern  . Not on file  Social History Narrative  . Not on file   Social Determinants of Health   Financial Resource Strain: Not on file  Food Insecurity: Not on file  Transportation Needs: Not on file  Physical Activity: Not on file  Stress: Not on file  Social Connections: Not on file     Family History: The patient's family history includes Diabetes in his unknown relative; Glaucoma in his unknown relative; High Cholesterol in his unknown relative; Hypertension in his unknown relative; Stroke in his unknown relative.  ROS:   Please see the history of present illness.     All other systems reviewed and are negative.  EKGs/Labs/Other Studies Reviewed:    The following studies were reviewed today:     Recent Labs: 10/07/2019: Hemoglobin 16.5; Platelets 231 01/13/2020: ALT 29; BUN 8; Creatinine, Ser 0.94; Potassium 4.3; Sodium 141  Recent Lipid Panel    Component Value Date/Time   CHOL 114 01/13/2020 1013   TRIG 102 01/13/2020 1013   HDL 43 01/13/2020 1013   CHOLHDL 2.7 01/13/2020 1013   LDLCALC 52 01/13/2020 1013    Physical Exam:    Physical  Exam: Blood pressure 124/70, pulse 86, height 6' (1.829 m), weight 208 lb 12.8 oz (94.7 kg), SpO2 95 %.  GEN:  Well nourished, well developed in no acute distress HEENT: Normal NECK: No JVD;  No carotid bruits LYMPHATICS: No lymphadenopathy CARDIAC: RRR , no murmurs, rubs, gallops RESPIRATORY:  Clear to auscultation without rales, wheezing or rhonchi  ABDOMEN: Soft, non-tender, non-distended MUSCULOSKELETAL:  No edema; No deformity  SKIN: Warm and dry NEUROLOGIC:  Alert and oriented x  3    EKG:        ASSESSMENT:    1. Carotid artery calcification, unspecified laterality      PLAN:      1.  CAD - doing well  , no further angina   ,     2.  Hyperlipidemia:     3.   Carotid calcifications:   He has carotid artery calcification noted by his dentist on a dental xray.   We will get a duplex scan for further evaluation of his carotid disease.  He [name] already on high dose statin and his LDL [name] down to 52.  Cont current meds.   Medication Adjustments/Labs and Tests Ordered: Current medicines are reviewed at length with the patient today.  Concerns regarding medicines are outlined above.  Orders Placed This Encounter  Procedures  . VAS US CAROTID   No orders of the defined types were placed in this encounter.    Patient Instructions  Medication Instructions:  The current medical regimen [name] effective;  continue present plan and medications.  *If you need a refill on your cardiac medications before your next appointment, please call your pharmacy*  Testing/Procedures: Your physician has requested that you have a carotid duplex. This test [name] an ultrasound of the carotid arteries in your neck. It looks at blood flow through these arteries that supply the brain with blood. Allow one hour for this exam. There are no restrictions or special instructions.  Follow-Up: At [facility], you and your health needs are our priority.  As part of our continuing mission to provide you  with exceptional heart care, we have created designated Provider Care Teams.  These Care Teams include your primary Cardiologist (physician) and Advanced Practice Providers (APPs -  Physician Assistants and Nurse Practitioners) who all work together to provide you with the care you need, when you need it.  We recommend signing up for the patient portal called "MyChart".  Sign up information [name] provided on this After Visit Summary.  MyChart [name] used to connect with patients for Virtual Visits (Telemedicine).  Patients are able to view lab/test results, encounter notes, upcoming appointments, etc.  Non-urgent messages can be sent to your provider as well.   To learn more about what you can do with MyChart, go to [email or web address].    Your next appointment:   Please keep previous appointment as scheduled.  Thank you for choosing [facility]!!        Signed, Kristeen Miss, MD  03/26/2020 3:15 PM    [facility]

## 2020-03-31 ENCOUNTER — Ambulatory Visit (HOSPITAL_COMMUNITY): Payer: Managed Care, Other (non HMO)

## 2020-04-27 ENCOUNTER — Ambulatory Visit (HOSPITAL_COMMUNITY)
Admission: RE | Admit: 2020-04-27 | Discharge: 2020-04-27 | Disposition: A | Discharge status: Home / Self Care | Payer: Managed Care, Other (non HMO) | Source: Ambulatory Visit | Attending: Cardiovascular Disease | Admitting: Cardiovascular Disease

## 2020-04-27 ENCOUNTER — Other Ambulatory Visit: Payer: Self-pay

## 2020-04-27 DIAGNOSIS — I10 Essential (primary) hypertension: Secondary | ICD-10-CM | POA: Insufficient documentation

## 2020-04-27 DIAGNOSIS — I779 Disorder of arteries and arterioles, unspecified: Secondary | ICD-10-CM

## 2020-04-27 DIAGNOSIS — I6529 Occlusion and stenosis of unspecified carotid artery: Secondary | ICD-10-CM | POA: Diagnosis present

## 2020-04-27 DIAGNOSIS — E785 Hyperlipidemia, unspecified: Secondary | ICD-10-CM | POA: Insufficient documentation

## 2020-04-27 DIAGNOSIS — Z87891 Personal history of nicotine dependence: Secondary | ICD-10-CM | POA: Insufficient documentation

## 2020-04-27 DIAGNOSIS — I2510 Atherosclerotic heart disease of native coronary artery without angina pectoris: Secondary | ICD-10-CM | POA: Diagnosis not present

## 2020-06-17 ENCOUNTER — Telehealth: Payer: Self-pay | Admitting: Cardiovascular Disease

## 2020-06-17 NOTE — Telephone Encounter (Signed)
Pt is s/p stenting in Aug. 2021.   Brilinta has become too expensive.   He needs to remain on DAPT until Aug. 2022. We will change the brilinta to Plavix.  He should take 150 mg on the first day and then 75 mg a day thereafter

## 2020-06-17 NOTE — Telephone Encounter (Signed)
Patient called in to see if there was another type of medication he can be on because ticagrelor (BRILINTA) 90 MG TABS tablet is becoming too expensive. He states the price of it keeps going up. Please advise

## 2020-06-17 NOTE — Telephone Encounter (Signed)
Will forward to Dr Elease Hashimoto to review for possible alternatives

## 2020-06-18 MED ORDER — CLOPIDOGREL BISULFATE 75 MG PO TABS: 75.0000 mg | ORAL_TABLET | Freq: Every day | ORAL | 3 refills | Status: DC

## 2020-06-18 NOTE — Telephone Encounter (Signed)
Patient called back to review name of medication (Plavix) and instructions for taking. Patient verbalized understanding.

## 2020-06-18 NOTE — Telephone Encounter (Signed)
RN spoke to patient regarding medication changes. Patient verbalized understanding. Medication list updated.

## 2020-06-18 NOTE — Telephone Encounter (Signed)
Patient is requesting to go over the medication changes again to ensure he will be taking it correctly.

## 2020-06-24 NOTE — Telephone Encounter (Signed)
Close encounter 

## 2020-06-30 ENCOUNTER — Encounter (HOSPITAL_COMMUNITY): Payer: Self-pay | Admitting: Emergency Medicine

## 2020-06-30 ENCOUNTER — Telehealth: Payer: Self-pay | Admitting: Cardiovascular Disease

## 2020-06-30 ENCOUNTER — Emergency Department (HOSPITAL_COMMUNITY): Payer: Managed Care, Other (non HMO)

## 2020-06-30 ENCOUNTER — Other Ambulatory Visit: Payer: Self-pay

## 2020-06-30 ENCOUNTER — Other Ambulatory Visit: Payer: Self-pay | Admitting: Home Health

## 2020-06-30 ENCOUNTER — Emergency Department (HOSPITAL_COMMUNITY)
Admission: EM | Admit: 2020-06-30 | Discharge: 2020-06-30 | Disposition: A | Discharge status: Home / Self Care | Payer: Managed Care, Other (non HMO) | Attending: Emergency Medicine | Admitting: Emergency Medicine

## 2020-06-30 DIAGNOSIS — Z7982 Long term (current) use of aspirin: Secondary | ICD-10-CM | POA: Insufficient documentation

## 2020-06-30 DIAGNOSIS — I10 Essential (primary) hypertension: Secondary | ICD-10-CM | POA: Insufficient documentation

## 2020-06-30 DIAGNOSIS — R0600 Dyspnea, unspecified: Secondary | ICD-10-CM | POA: Diagnosis not present

## 2020-06-30 DIAGNOSIS — Z7902 Long term (current) use of antithrombotics/antiplatelets: Secondary | ICD-10-CM | POA: Insufficient documentation

## 2020-06-30 DIAGNOSIS — I2510 Atherosclerotic heart disease of native coronary artery without angina pectoris: Secondary | ICD-10-CM | POA: Insufficient documentation

## 2020-06-30 DIAGNOSIS — R079 Chest pain, unspecified: Secondary | ICD-10-CM

## 2020-06-30 DIAGNOSIS — R0789 Other chest pain: Secondary | ICD-10-CM

## 2020-06-30 DIAGNOSIS — Z87891 Personal history of nicotine dependence: Secondary | ICD-10-CM | POA: Diagnosis not present

## 2020-06-30 DIAGNOSIS — Z79899 Other long term (current) drug therapy: Secondary | ICD-10-CM | POA: Diagnosis not present

## 2020-06-30 DIAGNOSIS — R072 Precordial pain: Secondary | ICD-10-CM | POA: Diagnosis not present

## 2020-06-30 LAB — CBC
HCT: 44.8 % (ref 39.0–52.0)
Hemoglobin: 15.1 g/dL (ref 13.0–17.0)
MCH: 31.1 pg (ref 26.0–34.0)
MCHC: 33.7 g/dL (ref 30.0–36.0)
MCV: 92.2 fL (ref 80.0–100.0)
Platelets: 257 10*3/uL (ref 150–400)
RBC: 4.86 MIL/uL (ref 4.22–5.81)
RDW: 13.3 % (ref 11.5–15.5)
WBC: 5 10*3/uL (ref 4.0–10.5)
nRBC: 0 % (ref 0.0–0.2)

## 2020-06-30 LAB — BASIC METABOLIC PANEL
Anion gap: 10 (ref 5–15)
BUN: 18 mg/dL (ref 8–23)
CO2: 27 mmol/L (ref 22–32)
Calcium: 9.2 mg/dL (ref 8.9–10.3)
Chloride: 104 mmol/L (ref 98–111)
Creatinine, Ser: 0.99 mg/dL (ref 0.61–1.24)
GFR, Estimated: >60 mL/min (ref >60)
Glucose, Bld: 110 mg/dL — ABNORMAL HIGH (ref 70–99)
Potassium: 4.1 mmol/L (ref 3.5–5.1)
Sodium: 141 mmol/L (ref 135–145)

## 2020-06-30 LAB — TROPONIN I (HIGH SENSITIVITY)
Troponin I (High Sensitivity): 14 ng/L (ref <18)
Troponin I (High Sensitivity): 17 ng/L (ref <18)

## 2020-06-30 MED ORDER — SODIUM CHLORIDE 0.9 % IV BOLUS
500.0000 mL | Freq: Once | INTRAVENOUS | Status: AC
  Administered 2020-06-30: 500 mL via INTRAVENOUS

## 2020-06-30 MED ORDER — KETOROLAC TROMETHAMINE 30 MG/ML IJ SOLN
15.0000 mg | Freq: Once | INTRAMUSCULAR | Status: AC
  Administered 2020-06-30: 15 mg via INTRAVENOUS
  Filled 2020-06-30: qty 1

## 2020-06-30 NOTE — Consult Note (Addendum)
Cardiology Consult    Patient ID: John [name] MRN: [id number]; DOB: [birth year]   Admission date: 06/30/2020  PCP:  John Gang, FNP   [facility]  Cardiologist:  John Miss, MD  Advanced Practice Provider:  No care team member to display Electrophysiologist:  None    Chief Complaint:  Chest pain  Patient Profile:   John [name] is a 60 y.o. male with PMH of HTN, HLD, Bell'[name] palsy, and CAD [name]/p PCI to mid LAD on 11/03/2019, who presented today with c/o of chest pain. Cardiology is asked to see the patient at the request of John. Jeraldine [name].   History of Present Illness:   John [name] follows John [name] outpatient primarily.   He had exertional chest pain leading to cardiac catheterization performed by John [name] on 11/03/2019 which showed 80% stenosis of 2nd diag unable to stent due to acute angle, 100% stenosis of mid LAD with R to L collaterals where he underwent successful PCI with DES, 25% stenosis Ost LAD to Prox LAD,  50% stenosis Prox to Mid RCA, 25% stenosis distal LAD, LV systolic function normal, LVEDP normal, and  EF 55-60%. He was recommended  DAPT for 12 month with ASA and Brilinta, with suggestion to re-evaluate RCA with DFR if further angina occurs.   He had improved chest pain with some occasional DOE reported on follow up appointments with John [name] on 11/13/2019 and 03/26/20. He was noted to having cartoid calcification on x-ray completed for dental work, where a carotid doppler was completed on 04/27/20 showing R ICA 1-39% stenosis and left carotid extracranial vessels near-normal. He had called the office on 06/17/20 reporting he can not afford Brilinta. He was switched to Plavix for continued DAPT until 11/02/2020.   Patient presented to ER today complaining chest pain.  Patient reports that he has occasional mild chest pain since his initial PCI. On sunday 06/27/20 he woke up from sleep with severe left sided chest pain, that was radiating to  his left shoulder/neck/and behind left ear area. Pain was sharp in quality, lasted about 5 minutes, and resolved after he took one tablet of SL Nitro. Yesterday he was at work doing some desk duty (non-exertional), he developed recurrent similar sharp chest pain. He took 1 tablet of SL Nitro again which relieved the pain. He felt slightly disoriented for a few seconds during that episode. 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 He took 2 tablets of SL Nitro and had resolved pain afterwards. He is concerned about recurrent chest pain and therefore came to ER for evaluation. He is taking all his medication accordingly, he just finished all his Brilinta and switched to Plavix last Thursday, reports no missed/gap for antiplatelets. He reports feeling his heart is beating fast over the past week, does not check BP or HR at home. He reports left thigh bruise a few days ago that seems resolved now. He reports intermittent hand and feet tingling sensation, but denied left arm paresthesia associated with chest pain. He denied any trauma or injury, active bleeding. He denied any significant SOB, nausea, vomiting, dizziness, syncope, orthopnea, PND, tobacco use, illicit drugs use.   Diagnostic work-up so far revealed unremarkable CBC.  BMP unremarkable. Troponin 14.  Chest x-ray revealed no acute finding.  EKG showed sinus rhythm without acute ST-T changes.  He is afebrile, hemodynamically stable at ED. He was given toradol 30mg  x1 and NS  at ED.  Cardiology is consulted for concerning of unstable angina .    Past Medical History:  Diagnosis Date  . Bell'[name] palsy 07/31/2013  . Displacement of lumbar intervertebral disc without myelopathy   . High cholesterol   . Hypertension     Past Surgical History:  Procedure Laterality Date  . CORONARY STENT INTERVENTION N/A 11/03/2019   Procedure: CORONARY STENT INTERVENTION;  Surgeon: John CraftsVaranasi, Jayadeep S,  MD;  [facility];  Service: Cardiovascular;  Laterality: N/A;  . INTRAVASCULAR ULTRASOUND/IVUS N/A 11/03/2019   Procedure: Intravascular Ultrasound/IVUS;  Surgeon: John CraftsVaranasi, Jayadeep S, MD;  [facility];  Service: Cardiovascular;  Laterality: N/A;  . LEFT HEART CATH AND CORONARY ANGIOGRAPHY N/A 11/03/2019   Procedure: LEFT HEART CATH AND CORONARY ANGIOGRAPHY;  Surgeon: John CraftsVaranasi, Jayadeep S, MD;  [facility];  Service: Cardiovascular;  Laterality: N/A;  . WRIST SURGERY Right      Medications Prior to Admission: Prior to Admission medications   Medication Sig Start Date End Date Taking? Authorizing Provider  aspirin EC 81 MG tablet Take 81 mg by mouth daily.    [provider]  atorvastatin (LIPITOR) 80 MG tablet Take 1 tablet (80 mg total) by mouth daily. 11/13/19 02/11/20  Bhagat, Sharrell KuBhavinkumar, PA  BLACK CURRANT SEED OIL PO Take 1,250 mg by mouth daily.    [provider]  carvedilol (COREG) 6.25 MG tablet Take 1 tablet (6.25 mg total) by mouth 2 (two) times daily. 11/13/19   Bhagat, Sharrell KuBhavinkumar, PA  cetirizine (ZYRTEC) 10 MG tablet Take 10 mg by mouth daily.    [provider]  Cholecalciferol (VITAMIN D-3) 125 MCG (5000 UT) TABS Take 1 tablet by mouth with breakfast, with lunch, and with evening meal.    [provider]  clopidogrel (PLAVIX) 75 MG tablet Take 1 tablet (75 mg total) by mouth daily. 06/18/20   Nahser, Deloris PingPhilip J, MD  FLUoxetine (PROZAC) 10 MG capsule Take 10 mg by mouth daily.  08/20/19   [provider]  gabapentin (NEURONTIN) 400 MG capsule Take 400 mg by mouth 2 (two) times daily as needed (pain).     [provider]  ibuprofen (ADVIL) 800 MG tablet Take 800 mg by mouth 3 (three) times daily as needed for moderate pain.  02/13/19   [provider]  losartan (COZAAR) 100 MG tablet Take 100 mg by mouth daily.    [provider]  nitroGLYCERIN (NITROSTAT) 0.3 MG SL  tablet Place 1 tablet under the tongue every 5 (five) minutes as needed for chest pain.  02/25/19   [provider]  OIL OF OREGANO PO Take 150 mg by mouth daily.    [provider]     Allergies:   No Known Allergies  Social History:   Social History   Socioeconomic History  . Marital status: Married    Spouse name: Not on file  . Number of children: Not on file  . Years of education: Not on file  . Highest education level: Not on file  Occupational History  . Not on file  Tobacco Use  . Smoking status: Former Games developermoker  . Smokeless tobacco: Never Used  Substance and Sexual Activity  . Alcohol use: Yes    Comment: occasionally   . Drug use: No  . Sexual activity: Not on file  Other Topics Concern  . Not on file  Social History Narrative  . Not on file   Social Determinants of Health  Financial Resource Strain: Not on file  Food Insecurity: Not on file  Transportation Needs: Not on file  Physical Activity: Not on file  Stress: Not on file  Social Connections: Not on file  Intimate Partner Violence: Not on file    Family History:   The patient'[name] family history includes Diabetes in an other family member; Glaucoma in an other family member; High Cholesterol in an other family member; Hypertension in an other family member; Stroke in an other family member.    ROS:  Constitutional: Denied fever, chills, malaise, night sweats Eyes: Denied vision change or loss Ears/Nose/Mouth/Throat: Denied ear ache, sore throat, coughing, sinus pain Cardiovascular: see HPI  Respiratory: denied shortness of breath Gastrointestinal: Denied nausea, vomiting, abdominal pain, diarrhea Genital/Urinary: Denied dysuria, hematuria, urinary frequency/urgency Musculoskeletal: Denied muscle ache, joint pain, weakness Skin: Denied rash, wound Neuro: Denied headache, dizziness, syncope Psych: Denied history of depression/anxiety  Endocrine: Denied history of  diabetes   Physical Exam/Data:   Vitals:   06/30/20 1230 06/30/20 1245 06/30/20 1300 06/30/20 1425  BP: 138/83 126/85  (!) 141/79  Pulse: 74 64 66 72  Resp: 19 13 15 15   Temp:      TempSrc:      SpO2: 97% 98% 98% 98%  Weight:      Height:       No intake or output data in the 24 hours ending 06/30/20 1454 Last 3 Weights 06/30/2020 03/26/2020 01/13/2020  Weight (lbs) 210 lb 208 lb 12.8 oz 219 lb 8 oz  Weight (kg) 95.255 kg 94.711 kg 99.565 kg     Body mass index is 28.48 kg/m.   Vitals:  Vitals:   06/30/20 1300 06/30/20 1425  BP:  (!) 141/79  Pulse: 66 72  Resp: 15 15  Temp:    SpO2: 98% 98%   General Appearance: In no apparent distress, laying in bed HEENT: Normocephalic, atraumatic. Oropharynx is clear, tongue midline.  Neck: Supple, trachea midline, no JVDs, no carotid bruits. Cardiovascular: Regular rate and rhythm, normal S1-S2,  no murmur/rub/gallop Respiratory: Resting breathing unlabored, lungs sounds clear to auscultation bilaterally, no use of accessory muscles. On room air.  No wheezes, rales or rhonchi.   Gastrointestinal: Bowel sounds positive, abdomen soft, non-tender, non-distended. No mass or organomegaly.  Extremities: Able to move all extremities in bed without difficulty, no edema/cyanosis/clubbing Genitourinary: genital exam not performed Musculoskeletal: Normal muscle bulk and tone, left medial thigh without bruise or hematoma noted, right upper thigh small bruise healing  Skin: Intact, warm, dry. No rashes or petechiae noted in exposed areas.  Neurologic: Alert, oriented to person, place and time. Fluent speech, left sided facial droop, no cognitive deficit  Psychiatric: Normal affect. Mood is appropriate.    EKG:  The ECG that was done on 06/30/20 10:49 AM  was personally reviewed and demonstrates NSR with ventricular rate of 69 bpm, no acute ST-T change noted.   Relevant CV Studies:  Left heart cath 11/03/2019:   2nd Diag lesion is 80% stenosed.  THis could not be wired through the stent struts, or before stent placement due to the acute angle.  Mid LAD lesion is 100% stenosed. Right to left collaterals. This was a chronic total occlusion.  A drug-eluting stent was successfully placed across the CTO using a SYNERGY XD [id number], postdilated to 3.5 mm proximally and optimized with IVUS.  Post intervention, there is a 0% residual stenosis. IVUS showed no evidence of edge dissection.  Ost LAD to Prox LAD lesion is 25%  stenosed.  Prox RCA to Mid RCA lesion is 50% stenosed. Diffuse disease in the mid portion.  Dist LAD lesion is 25% stenosed.  The left ventricular systolic function is normal.  LV end diastolic pressure is normal.  The left ventricular ejection fraction is 55-65% by visual estimate.  There is no aortic valve stenosis.   Continue aggressive secondary prevention.  DAPT for 12 months.  Brilinta for at least 1 month.  Could switch to Plavix if there is an issue getting Brilinta.    If he has further angina, could re-evaluate the RCA with DFR, but I think his occluded LAD was the culprit.    Echo from 03/20/2019:  mild dilated left atrium, borderline normal LV systolic function, normal wall motion, mild LVH, mild left ventricular diastolic dysfunction, mild PR, TR, and MR  Laboratory Data:  High Sensitivity Troponin:   Recent Labs  Lab 06/30/20 1102  TROPONINIHS 14      Chemistry Recent Labs  Lab 06/30/20 1102  NA 141  K 4.1  CL 104  CO2 27  GLUCOSE 110*  BUN 18  CREATININE 0.99  CALCIUM 9.2  GFRNONAA >60  ANIONGAP 10    No results for input([name]): PROT, ALBUMIN, AST, ALT, ALKPHOS, BILITOT in the last 168 hours. Hematology Recent Labs  Lab 06/30/20 1102  WBC 5.0  RBC 4.86  HGB 15.1  HCT 44.8  MCV 92.2  MCH 31.1  MCHC 33.7  RDW 13.3  PLT 257   BNPNo results for input([name]): BNP, PROBNP in the last 168 hours.  DDimer No results for input([name]): DDIMER in the last 168  hours.   Radiology/Studies:  DG Chest 2 View  Result Date: 06/30/2020 CLINICAL DATA:  Chest pain beginning 3 days ago. EXAM: CHEST - 2 VIEW COMPARISON:  07/28/2012 FINDINGS: Heart size is normal. Coronary artery stent evident in the left system. Mediastinal shadows are otherwise normal. The lungs are clear. The vascularity is normal. No effusions. No significant bone finding. IMPRESSION: No active disease. Coronary artery stent. Electronically Signed   By: Paulina Fusi M.D.   On: 06/30/2020 11:36     Assessment and Plan:   Chest pain CAD [name]/p PCI to mid LAD 11/03/19 - presented with recurrent sharp left sided chest pain to shoulder/neck area since 06/27/20, pain relieved with Nitro, not triggered by exertion, mixed feature  - Hs Trop negative x1, 2nd trop pending  - EKG and telemetry did not reveal acute findings - continue medical therapy with ASA, Plavix, statin, Coreg, losartan , PRN Nitro  - if 2nd Hs Trop negative, ACS ruled out, OK to discharge home, will arrange outpatient NM stress test and follow up with John [name] appointment made on 07/07/20 on 8:20AM , patient agreeable  - if 2nd Hs Trop positive, cardiology will admit the patient and plan for cath tomorrow, patient agreeable   Hypertension - BP overall fairly controlled , continue coreg and losartan   Hyperlipidemia - continue statin   Bell'[name] Palsy in 2012 - supportive care  Neuropathy - on PRN gabapentin at home   For questions or updates, please contact [facility] Please consult [email or web address] for contact info under    Signed, Cyndi Bender, NP  06/30/2020 2:54 PM   Patient seen, examined. Available data reviewed. Agree with findings, assessment, and plan as outlined by Cyndi Bender, NP.  The patient is independently interviewed and examined.  He is alert, oriented, in no distress.  HEENT is normal, JVP is normal, carotid upstrokes are normal  with no bruits, lungs are clear bilaterally, heart is regular rate and rhythm  with no murmur or gallop, abdomen is soft and nontender with no masses, extremities have no pretibial edema.  Skin is warm and dry with no rash.  Neurologic is grossly intact with 5/5 strength bilaterally.  The patient'[name] EKG shows normal sinus rhythm and is within normal limits.  High-sensitivity troponins are 14 and 17, both within normal limits.  Chest x-ray shows no acute changes.  Patient is chest pain-free now.  He has had few episodes of chest discomfort leading up to this hospitalization.  Symptoms are both typical and atypical with some features.  The patient does not have associated shortness of breath and he did have this prior to undergoing LAD stenting.  I reviewed his previous cardiac catheterization films which demonstrated moderate nonobstructive CAD involving the right coronary artery.  I think with his normal high-sensitivity troponins, it is unlikely that his symptoms truly represent acute coronary syndrome.  However, I do think this warrants further evaluation.  The patient is medically stable for hospital discharge since he is chest pain-free and has 2 negative high-sensitivity troponins.  Return precautions are reviewed.  I have recommended a Lexiscan Myoview stress test for further risk stratification.  This will be arranged as an outpatient.  The patient will follow up with John. Elease [name] after his stress test is completed.  If he has progressive symptoms of angina or an abnormal stress test, cardiac catheterization would be appropriate.  Tonny Bollman, M.D. 06/30/2020 4:18 PM

## 2020-06-30 NOTE — ED Provider Notes (Signed)
[facility] EMERGENCY DEPARTMENT Provider Note   CSN: [id number] Arrival date & time: 06/30/20  1036     History No chief complaint on file.   John [name] is a 60 y.o. male.    Patient presents with chest pain.  Pain began about 2 and half days ago, while he was at work. Patient does have a history of CAD, prior stenting, but does not have angina. Now over the past 2 half days patient has had pain, both with exertion and at rest.  This most recent episode occurred just prior to ED arrival.  Pain is sternal.  Pain is minimally improved with nitroglycerin, aspirin.  No associated dyspnea, no syncope.  Patient has been taking his medication as directed.  There has been no notable change, he was previously on Brilinta, but switched to Plavix due to insurance concerns.  Today with chest pain, concern for angina he spoke with his cardiology office and was sent here for evaluation.    Past Medical History:  Diagnosis Date  . Bell's palsy 07/31/2013  . Displacement of lumbar intervertebral disc without myelopathy   . High cholesterol   . Hypertension     Patient Active Problem List   Diagnosis Date Noted  . Carotid artery calcification 03/26/2020  . CAD (coronary artery disease) 11/03/2019  . Hypertension 11/03/2019  . Hyperlipidemia LDL goal <70 11/03/2019  . Unstable angina (HCC) 08/06/2019  . Bell's palsy 07/31/2013    Past Surgical History:  Procedure Laterality Date  . CORONARY STENT INTERVENTION N/A 11/03/2019   Procedure: CORONARY STENT INTERVENTION;  Surgeon: Corky Crafts, MD;  Location: [facility] INVASIVE CV LAB;  Service: Cardiovascular;  Laterality: N/A;  . INTRAVASCULAR ULTRASOUND/IVUS N/A 11/03/2019   Procedure: Intravascular Ultrasound/IVUS;  Surgeon: Corky Crafts, MD;  Location: [facility] INVASIVE CV LAB;  Service: Cardiovascular;  Laterality: N/A;  . LEFT HEART CATH AND CORONARY ANGIOGRAPHY N/A 11/03/2019   Procedure: LEFT HEART CATH AND  CORONARY ANGIOGRAPHY;  Surgeon: Corky Crafts, MD;  Location: [facility] INVASIVE CV LAB;  Service: Cardiovascular;  Laterality: N/A;  . WRIST SURGERY Right        Family History  Problem Relation Age of Onset  . Stroke Other   . High Cholesterol Other   . Hypertension Other   . Glaucoma Other   . Diabetes Other     Social History   Tobacco Use  . Smoking status: Former Games developer  . Smokeless tobacco: Never Used  Substance Use Topics  . Alcohol use: Yes    Comment: occasionally   . Drug use: No    Home Medications Prior to Admission medications   Medication Sig Start Date End Date Taking? Authorizing Provider  aspirin EC 81 MG tablet Take 81 mg by mouth daily.    [provider]  atorvastatin (LIPITOR) 80 MG tablet Take 1 tablet (80 mg total) by mouth daily. 11/13/19 02/11/20  Bhagat, Sharrell Ku, PA  BLACK CURRANT SEED OIL PO Take 1,250 mg by mouth daily.    [provider]  carvedilol (COREG) 6.25 MG tablet Take 1 tablet (6.25 mg total) by mouth 2 (two) times daily. 11/13/19   Bhagat, Sharrell Ku, PA  cetirizine (ZYRTEC) 10 MG tablet Take 10 mg by mouth daily.    [provider]  Cholecalciferol (VITAMIN D-3) 125 MCG (5000 UT) TABS Take 1 tablet by mouth with breakfast, with lunch, and with evening meal.    [provider]  clopidogrel (PLAVIX) 75 MG tablet Take 1  tablet (75 mg total) by mouth daily. 06/18/20   Nahser, Deloris Ping, MD  FLUoxetine (PROZAC) 10 MG capsule Take 10 mg by mouth daily.  08/20/19   [provider]  gabapentin (NEURONTIN) 400 MG capsule Take 400 mg by mouth 2 (two) times daily as needed (pain).     [provider]  ibuprofen (ADVIL) 800 MG tablet Take 800 mg by mouth 3 (three) times daily as needed for moderate pain.  02/13/19   [provider]  losartan (COZAAR) 100 MG tablet Take 100 mg by mouth daily.    [provider]  nitroGLYCERIN (NITROSTAT) 0.3 MG SL tablet Place 1 tablet under the  tongue every 5 (five) minutes as needed for chest pain.  02/25/19   [provider]  OIL OF OREGANO PO Take 150 mg by mouth daily.    [provider]    Allergies    Patient has no known allergies.  Review of Systems   Review of Systems  Constitutional:       Per HPI, otherwise negative  HENT:       Per HPI, otherwise negative  Respiratory:       Per HPI, otherwise negative  Cardiovascular:       Per HPI, otherwise negative  Gastrointestinal: Negative for vomiting.  Endocrine:       Negative aside from HPI  Genitourinary:       Neg aside from HPI   Musculoskeletal:       Per HPI, otherwise negative  Skin: Negative.   Neurological: Negative for syncope.    Physical Exam Updated Vital Signs BP 127/82 (BP Location: Left Arm)   Pulse 74   Temp 98 F (36.7 C) (Oral)   Resp 13   Ht 6' (1.829 m)   Wt 95.3 kg   SpO2 99%   BMI 28.48 kg/m   Physical Exam Vitals and nursing note reviewed.  Constitutional:      General: He is not in acute distress.    Appearance: He is well-developed.  HENT:     Head: Normocephalic and atraumatic.  Eyes:     Conjunctiva/sclera: Conjunctivae normal.  Cardiovascular:     Rate and Rhythm: Normal rate and regular rhythm.  Pulmonary:     Effort: Pulmonary effort is normal. No respiratory distress.     Breath sounds: No stridor.  Abdominal:     General: There is no distension.  Skin:    General: Skin is warm and dry.  Neurological:     Mental Status: He is alert and oriented to person, place, and time.      ED Results / Procedures / Treatments   Labs (all labs ordered are listed, but only abnormal results are displayed) Labs Reviewed  CBC  BASIC METABOLIC PANEL  TROPONIN I (HIGH SENSITIVITY)  TROPONIN I (HIGH SENSITIVITY)    EKG EKG Interpretation  Date/Time:  Wednesday June 30 2020 10:49:29 EDT Ventricular Rate:  69 PR Interval:  154 QRS Duration: 86 QT Interval:  358 QTC Calculation: 383 R  Axis:   18 Text Interpretation: Normal sinus rhythm Normal ECG Confirmed by Gerhard Munch [phone]) on 06/30/2020 11:38:58 AM   Radiology DG Chest 2 View  Result Date: 06/30/2020 CLINICAL DATA:  Chest pain beginning 3 days ago. EXAM: CHEST - 2 VIEW COMPARISON:  07/28/2012 FINDINGS: Heart size is normal. Coronary artery stent evident in the left system. Mediastinal shadows are otherwise normal. The lungs are clear. The vascularity is normal. No effusions. No  significant bone finding. IMPRESSION: No active disease. Coronary artery stent. Electronically Signed   By: Paulina Fusi M.D.   On: 06/30/2020 11:36    Procedures Procedures   Medications Ordered in ED Medications  sodium chloride 0.9 % bolus 500 mL (has no administration in time range)  ketorolac (TORADOL) 30 MG/ML injection 15 mg (has no administration in time range)    ED Course  I have reviewed the triage vital signs and the nursing notes.  Pertinent labs & imaging results that were available during my care of the patient were reviewed by me and considered in my medical decision making (see chart for details).  Cardiac 70 sinus normal Pulse ox 99% room air normal    Update:, Patient in no distress.  I discussed this case with her cardiology colleagues.  Update:, Patient has been seen and evaluated by cardiology.  He remains in no distress.  We discussed that evaluation again, reassuring troponins, nonischemic EKG, reassuring x-ray.  Given the duration of several days of symptoms, with no substantial delta of his troponin, and after cardiology consultation, the patient will follow up with him in the clinic for Myoview provocative testing. MDM Rules/Calculators/A&P MDM Number of Diagnoses or Management Options Atypical chest pain: new, needed workup   Amount and/or Complexity of Data Reviewed Clinical lab tests: ordered and reviewed Tests in the radiology section of CPT: reviewed and ordered Tests in the medicine section  of CPT: ordered and reviewed Decide to obtain previous medical records or to obtain history from someone other than the patient: yes Review and summarize past medical records: yes Discuss the patient with other providers: yes Independent visualization of images, tracings, or specimens: yes  Risk of Complications, Morbidity, and/or Mortality Presenting problems: high Diagnostic procedures: high Management options: high  Critical Care Total time providing critical care: < 30 minutes  Patient Progress Patient progress: stable  Final Clinical Impression(s) / ED Diagnoses Final diagnoses:  Atypical chest pain     Gerhard Munch, MD 07/01/20 1008

## 2020-06-30 NOTE — Discharge Instructions (Signed)
Please be sure to keep your follow-up appointment.  Return here for concerning changes in your condition.

## 2020-06-30 NOTE — Telephone Encounter (Signed)
Pt reports that he took 2 nitro yesterday, some relief. He has taken 1 this morning, about 30 min ago.  CP remains w/ radiating pain to neck/arm. Pt advised to take 2nd nitro and have a driver take him to ED now for evaluation. Pt agreeable to plan and heading there now. Notified cardiology cardmaster.

## 2020-06-30 NOTE — Progress Notes (Signed)
error 

## 2020-06-30 NOTE — ED Triage Notes (Signed)
Pt  Here with c/o chest pain left side , pt took 2 nitro before coming in , pt had one stent placed last fall ,

## 2020-06-30 NOTE — Telephone Encounter (Signed)
    Pt c/o of Chest Pain: STAT if CP now or developed within 24 hours  1. Are you having CP right now? Yes, lingering pain  2. Are you experiencing any other symptoms (ex. SOB, nausea, vomiting, sweating)? No  3. How long have you been experiencing CP? 30 mins ago  4. Is your CP continuous or coming and going? continuous   5. Have you taken Nitroglycerin? Yes    Pt first CP was Sunday, then he had an episode again yesterday while at work, he took 2 Nitroglycerin and pt said pain kind of subside, last night he felt the CP again and woke him up in the middle of the night, he again felt another CP feels like on top of his heart and through his left arm, took another 1 Nitroglycerin 30 minutes and still feeling lingering pain on his chest

## 2020-06-30 NOTE — Progress Notes (Signed)
Ordered outpatient Myoview for further evaluation of chest pain. Please see consult note from today for additional information. Marjie Skiff, PA-C, called patient and consent him for study.  Shared Decision Making/Informed Consent{ The risks [chest pain, shortness of breath, cardiac arrhythmias, dizziness, blood pressure fluctuations, myocardial infarction, stroke/transient ischemic attack, nausea, vomiting, allergic reaction, radiation exposure, metallic taste sensation and life-threatening complications (estimated to be 1 in 10,000)], benefits (risk stratification, diagnosing coronary artery disease, treatment guidance) and alternatives of a nuclear stress test were discussed in detail with John [name] and he agrees to proceed.

## 2020-06-30 NOTE — ED Notes (Signed)
Patient transported to X-ray 

## 2020-07-02 ENCOUNTER — Encounter (HOSPITAL_COMMUNITY): Payer: Managed Care, Other (non HMO)

## 2020-07-06 ENCOUNTER — Telehealth (HOSPITAL_COMMUNITY): Payer: Self-pay | Admitting: *Deleted

## 2020-07-06 NOTE — Telephone Encounter (Signed)
Left message on voicemail per DPR in reference to upcoming appointment scheduled on 07/12/20 at 8:00 with detailed instructions given per Myocardial Perfusion Study Information Sheet for the test. LM to arrive 15 minutes early, and that it is imperative to arrive on time for appointment to keep from having the test rescheduled. If you need to cancel or reschedule your appointment, please call the office within 24 hours of your appointment. Failure to do so may result in a cancellation of your appointment, and a $50 no show fee. Phone number given for call back for any questions.

## 2020-07-07 ENCOUNTER — Ambulatory Visit: Payer: Managed Care, Other (non HMO) | Admitting: Cardiovascular Disease

## 2020-07-09 ENCOUNTER — Encounter (HOSPITAL_COMMUNITY): Payer: Managed Care, Other (non HMO)

## 2020-07-12 ENCOUNTER — Ambulatory Visit (HOSPITAL_COMMUNITY): Payer: Managed Care, Other (non HMO) | Attending: Cardiology

## 2020-07-12 DIAGNOSIS — R079 Chest pain, unspecified: Secondary | ICD-10-CM | POA: Diagnosis not present

## 2020-07-12 LAB — MYOCARDIAL PERFUSION IMAGING
LV dias vol: 77 mL (ref 62–150)
LV sys vol: 30 mL
Peak HR: 89 {beats}/min
Rest HR: 64 {beats}/min
SDS: 0
SRS: 0
SSS: 0
TID: 0.9

## 2020-07-12 MED ORDER — TECHNETIUM TC 99M TETROFOSMIN IV KIT
10.3000 | PACK | Freq: Once | INTRAVENOUS | Status: AC | PRN
Start: 2020-07-12 — End: 2020-07-12
  Administered 2020-07-12: 10.3 via INTRAVENOUS
  Filled 2020-07-12: qty 11

## 2020-07-12 MED ORDER — TECHNETIUM TC 99M TETROFOSMIN IV KIT
31.3000 | PACK | Freq: Once | INTRAVENOUS | Status: AC | PRN
  Administered 2020-07-12: 31.3 via INTRAVENOUS
  Filled 2020-07-12: qty 32

## 2020-07-12 MED ORDER — REGADENOSON 0.4 MG/5ML IV SOLN
0.4000 mg | Freq: Once | INTRAVENOUS | Status: AC
  Administered 2020-07-12: 0.4 mg via INTRAVENOUS

## 2020-08-08 NOTE — Progress Notes (Signed)
Office Visit    Patient Name: John [name] Date of Encounter: 08/09/2020  PCP:  Armando Gang, FNP   [facility]  Cardiologist:  Kristeen Miss, MD  Advanced Practice Provider:  No care team member to display Electrophysiologist:  None   Chief Complaint    John [name] is a 60 y.o. male with a hx of  CAD s/p DES to LAD, HLD, HTN, Bell's palsy presents today for follow up after stress test   Past Medical History    Past Medical History:  Diagnosis Date  . Bell's palsy 07/31/2013  . Displacement of lumbar intervertebral disc without myelopathy   . High cholesterol   . Hypertension    Past Surgical History:  Procedure Laterality Date  . CORONARY STENT INTERVENTION N/A 11/03/2019   Procedure: CORONARY STENT INTERVENTION;  Surgeon: Corky Crafts, MD;  Location: [facility] INVASIVE CV LAB;  Service: Cardiovascular;  Laterality: N/A;  . INTRAVASCULAR ULTRASOUND/IVUS N/A 11/03/2019   Procedure: Intravascular Ultrasound/IVUS;  Surgeon: Corky Crafts, MD;  Location: [facility] INVASIVE CV LAB;  Service: Cardiovascular;  Laterality: N/A;  . LEFT HEART CATH AND CORONARY ANGIOGRAPHY N/A 11/03/2019   Procedure: LEFT HEART CATH AND CORONARY ANGIOGRAPHY;  Surgeon: Corky Crafts, MD;  Location: [facility] INVASIVE CV LAB;  Service: Cardiovascular;  Laterality: N/A;  . WRIST SURGERY Right     Allergies  No Known Allergies  History of Present Illness    John [name] is a 60 y.o. male with a hx of CAD s/p DES to LAD, HLD, HTN, Bell's palsy last seen in the ED.  [place] heart cath 11/03/19 2nd diagonal 80% stenosed and unable to be wired due to acute angle, mid LAD lesion 100% with right-to-left collateral, ost-prox LAD 25%, prox-mid RCA 50%, dist LAD 25%. A DES was placed across the CTO of the mid LAD. LVEF 55-65%. Recommended for DAPT for 12 months. Suggestion to re-evaluate RCA with DFR if angina recurs.   Carotid calcification on dental work led to subsequent  carotid duplex 04/27/20 with RICA 1-39% stenosis and LICA minimal wall thickening or plaque.   April 2022 he was transitioned from Brilinta to Plavix due to cost.   He was evaluated in the ED 06/30/20 due to chest pain. Diagnostic workup including CBC, BMP, troponinx2 negative, CXR, EKG unremarkable. He was recommended for outpatient stress test. He underwent gated myoview 07/12/20 with LVEF 61%, no ST/T wave changes, normal myocardial perfusion - low risk study.   He presents today for follow up. Since ED visit notes occasional episodes of nitroglycerin requiring one nitroglycerin 2-3 times. Episodes occurring both at rest and with exertion. Tells me it is more often with exertion. Tells me his shortness of breath happens before the chest pain. Notes no shortness of breath at rest. 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 Works an active job 12 hours on his feet alternating days and night which is understandably tiresome.  Reports occasional productive cough related to sinuses and no cough. Notes lightheadedness with position changes but no dizziness, syncope, near syncope. Tells me he has issues "trying to control" his symptoms which he feels causes more stress.  EKGs/Labs/Other Studies Reviewed:   The following studies were reviewed today:  Stress test 07/12/20 Nuclear stress EF: 61%. The left ventricular ejection fraction is normal (55-65%). There was no ST segment deviation noted during stress. No T wave inversion was noted  during stress. Normal myocardial perfusion with no evidence of ischemia or infarction. The study is normal. This is a low risk study.  Left heart cath 11/03/2019:    2nd Diag lesion is 80% stenosed. THis could not be wired through the stent struts, or before stent placement due to the acute angle.  Mid LAD lesion is 100% stenosed. Right to left collaterals. This was a chronic total occlusion.  A  drug-eluting stent was successfully placed across the CTO using a SYNERGY XD [id number], postdilated to 3.5 mm proximally and optimized with IVUS.  Post intervention, there is a 0% residual stenosis. IVUS showed no evidence of edge dissection.  Ost LAD to Prox LAD lesion is 25% stenosed.  Prox RCA to Mid RCA lesion is 50% stenosed. Diffuse disease in the mid portion.  Dist LAD lesion is 25% stenosed.  The left ventricular systolic function is normal.  LV end diastolic pressure is normal.  The left ventricular ejection fraction is 55-65% by visual estimate.  There is no aortic valve stenosis.   Continue aggressive secondary prevention.  DAPT for 12 months.  Brilinta for at least 1 month.  Could switch to Plavix if there is an issue getting Brilinta.     If he has further angina, could re-evaluate the RCA with DFR, but I think his occluded LAD was the culprit.      Echo from 03/20/2019:   mild dilated left atrium, borderline normal LV systolic function, normal wall motion, mild LVH, mild left ventricular diastolic dysfunction, mild PR, TR, and MR  EKG:  No EKG today.  Recent Labs: 01/13/2020: ALT 29 06/30/2020: BUN 18; Creatinine, Ser 0.99; Hemoglobin 15.1; Platelets 257; Potassium 4.1; Sodium 141  Recent Lipid Panel    Component Value Date/Time   CHOL 114 01/13/2020 1013   TRIG 102 01/13/2020 1013   HDL 43 01/13/2020 1013   CHOLHDL 2.7 01/13/2020 1013   LDLCALC 52 01/13/2020 1013   Home Medications   Current Meds  Medication Sig  . aspirin EC 81 MG tablet Take 81 mg by mouth daily.  Marland Kitchen BLACK CURRANT SEED OIL PO Take 1,250 mg by mouth daily.  . carvedilol (COREG) 6.25 MG tablet Take 1 tablet (6.25 mg total) by mouth 2 (two) times daily.  . cetirizine (ZYRTEC) 10 MG tablet Take 10 mg by mouth daily.  . Cholecalciferol (VITAMIN D-3) 125 MCG (5000 UT) TABS Take 1 tablet by mouth with breakfast, with lunch, and with evening meal.  . clopidogrel (PLAVIX) 75 MG tablet Take 1 tablet  (75 mg total) by mouth daily.  Marland Kitchen FLUoxetine (PROZAC) 10 MG capsule Take 10 mg by mouth daily.   Marland Kitchen gabapentin (NEURONTIN) 400 MG capsule Take 400 mg by mouth 2 (two) times daily as needed (pain).   Marland Kitchen ibuprofen (ADVIL) 800 MG tablet Take 800 mg by mouth 3 (three) times daily as needed for moderate pain.   . isosorbide mononitrate (IMDUR) 30 MG 24 hr tablet Take 1 tablet (30 mg total) by mouth daily.  Marland Kitchen losartan (COZAAR) 50 MG tablet Take 1 tablet (50 mg total) by mouth daily.  . nitroGLYCERIN (NITROSTAT) 0.3 MG SL tablet Place 1 tablet under the tongue every 5 (five) minutes as needed for chest pain.   . OIL OF OREGANO PO Take 150 mg by mouth daily.  . TURMERIC PO Take 120 mg by mouth. Per patient taking 1 tablet twice a day  . [DISCONTINUED] losartan (COZAAR) 100 MG tablet Take 100 mg by mouth daily.  Review of Systems  All other systems reviewed and are otherwise negative except as noted above.  Physical Exam    VS:  BP 124/80   Pulse 70   Ht 6' (1.829 m)   Wt 219 lb 9.6 oz (99.6 kg)   SpO2 99%   BMI 29.78 kg/m  , BMI Body mass index is 29.78 kg/m.  Wt Readings from Last 3 Encounters:  08/09/20 219 lb 9.6 oz (99.6 kg)  07/12/20 210 lb (95.3 kg)  06/30/20 210 lb (95.3 kg)     GEN: Well nourished, well developed, in no acute distress. HEENT: normal. Neck: Supple, no JVD, carotid bruits, or masses. Cardiac: RRR, no murmurs, rubs, or gallops. No clubbing, cyanosis, edema.  Radials/PT 2+ and equal bilaterally.  Respiratory:  Respirations regular and unlabored, clear to auscultation bilaterally. GI: Soft, nontender, nondistended. MS: No deformity or atrophy. Skin: Warm and dry, no rash. Neuro:  Strength and sensation are intact. Psych: Normal affect.  Assessment & Plan    1. CAD - 10/2019 DES to LAD recommended for DAPT for 12 months. GDMT includes aspirin, plavix, carvedilol, atorvastatin, PRN nitroglycerin. Stress test 07/12/20 low risk study. Reports continued episodes of  chest discomfort both at rest and with activity. Start Imdur 30mg  daily.  Heart healthy diet and regular cardiovascular exercise encouraged. If symptoms do not improve with Imdur, may need to consider repeat cardiac catheterization.  2. HTN - Notes some orthostasic lightheadedness. To prevent hypotension with addition of Imdur, reduce Losartan to 50mg  daily.   3. HLD, LDL goal <70 - 01/2020 LDL 52. Continue Atorvastatin 80mg  daily.  Disposition: Follow up in 6 week(s) with Dr. or APP   Signed, 02/2020, NP 08/09/2020, 12:40 PM [facility]

## 2020-08-09 ENCOUNTER — Encounter: Payer: Self-pay | Admitting: Family

## 2020-08-09 ENCOUNTER — Other Ambulatory Visit: Payer: Self-pay

## 2020-08-09 ENCOUNTER — Ambulatory Visit (INDEPENDENT_AMBULATORY_CARE_PROVIDER_SITE_OTHER): Payer: Managed Care, Other (non HMO) | Admitting: Family

## 2020-08-09 VITALS — BP 124/80 | HR 70 | Ht 72.0 in | Wt 219.6 lb

## 2020-08-09 DIAGNOSIS — E785 Hyperlipidemia, unspecified: Secondary | ICD-10-CM

## 2020-08-09 DIAGNOSIS — I10 Essential (primary) hypertension: Secondary | ICD-10-CM

## 2020-08-09 DIAGNOSIS — I25118 Atherosclerotic heart disease of native coronary artery with other forms of angina pectoris: Secondary | ICD-10-CM | POA: Diagnosis not present

## 2020-08-09 MED ORDER — LOSARTAN POTASSIUM 50 MG PO TABS: 50.0000 mg | ORAL_TABLET | Freq: Every day | ORAL | 1 refills | Status: DC

## 2020-08-09 MED ORDER — ISOSORBIDE MONONITRATE ER 30 MG PO TB24: 30.0000 mg | ORAL_TABLET | Freq: Every day | ORAL | 1 refills | Status: DC

## 2020-08-09 NOTE — Patient Instructions (Addendum)
Medication Instructions:  Your physician has recommended you make the following change in your medication:   DECREASE to Losartan 50mg  daily  START Isosorbide Mononitrate 30mg  every evening  *If you need a refill on your cardiac medications before your next appointment, please call your pharmacy*  Lab Work: None ordered today  Testing/Procedures: Your stress test was low risk which is a good result!   Follow-Up: At [facility], you and your health needs are our priority.  As part of our continuing mission to provide you with exceptional heart care, we have created designated Provider Care Teams.  These Care Teams include your primary Cardiologist (physician) and Advanced Practice Providers (APPs -  Physician Assistants and Nurse Practitioners) who all work together to provide you with the care you need, when you need it.  We recommend signing up for the patient portal called "MyChart".  Sign up information is provided on this After Visit Summary.  MyChart is used to connect with patients for Virtual Visits (Telemedicine).  Patients are able to view lab/test results, encounter notes, upcoming appointments, etc.  Non-urgent messages can be sent to your provider as well.   To learn more about what you can do with MyChart, go to .    Your next appointment:   6 week(s)  The format for your next appointment:   In Person  Provider:   You may see [facility], MD or one of the following Advanced Practice Providers on your designated Care Team:   Other Instructions  Heart Healthy Diet Recommendations: A low-salt diet is recommended. Meats should be grilled, baked, or boiled. Avoid fried foods. Focus on lean protein sources like fish or chicken with vegetables and fruits. The American Heart Association is a [employer]!  American Heart Association Diet and Lifeystyle Recommendations   Exercise recommendations: The American Heart Association recommends 150  minutes of moderate intensity exercise weekly. Try 30 minutes of moderate intensity exercise 4-5 times per week. This could include walking, jogging, or swimming.

## 2020-08-18 ENCOUNTER — Telehealth: Payer: Self-pay | Admitting: Cardiovascular Disease

## 2020-08-18 NOTE — Telephone Encounter (Signed)
Called patient and left a message to inform him of the $29 fee and authorization needed to complete the paperwork.  AO 08/18/20

## 2020-08-19 NOTE — Telephone Encounter (Signed)
Returned patient's call and spoke to him. He stated that they sent the paperwork to the wrong Doctor (Dr. Elease Hashimoto) and that he was not going to need the form/medical evaluation report completed. AO 08/19/20

## 2020-09-26 ENCOUNTER — Encounter: Payer: Self-pay | Admitting: Cardiovascular Disease

## 2020-09-26 NOTE — H&P (View-Only) (Signed)
Cardiology Office Note:    Date:  09/27/2020   ID:  John [name], DOB 1960/12/24, MRN [id number]  PCP:  Armando Gang, FNP  Cardiologist:  Becket Wecker  Electrophysiologist:  None   Referring MD: Armando Gang, FNP   Chief Complaint  Patient presents with   Coronary Artery Disease    Previous notes:      John [name] is a 60 y.o. male with a hx of hypertension and hyperlipidemia.  He is seen today for further evaluation of some exertional chest discomfort.  We are asked to see him today by Dr. Welton Flakes  for further evaluation of this exertional chest pain.  Started having exertional CP while push mowing his yard in 2019. Would have to stop and rest. Later , he noticed CP while climbing the [place].  CP was achey,  Would last several minutes.  Would have to stop and relax .  Associated with sweats and dyspnea.   No dizziness  Is having to limit his exercise because of this cp .   Was in the hospital at [facility] with cp.   Does not recall whether or not he had a stress test.    October 07, 2019:  Mr. Weedon is seen today for follow-up visit.  We saw him several months ago he was having episodes of chest discomfort.  We discussed heart catheterization and also discussed stress testing.  We schedule him for stress test but he never test done.  His sone passed away in 08/22/22 and did not make it to the stress appt.  Still has some CP.  Occurs with heavy lifting and stress.  Pains last for a minute or so .  He has been having exertional chest pain for the past year or so.  They seem to be progressing.  January 13, 2020: John [name] is seen back today for follow-up of his coronary artery disease.  He status post stenting of his mid LAD.  There was a second diagonal stenosis that could not be wired because of the previously placed LAD stent.  CP is much better,  Has some doe on occasion .   Jan. 21, 2022: John [name] is seen today for work in visit.  He was recently at the  dentist and was noted to have carotid calcifications on xray . 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  Follow-up as needed.   Heart catheterization November 03, 2019 shows that his mid LAD is occluded after the second diagonal.  He has right to left collaterals.  He has a second diagonal vessel that has an 80% stenosis.  This could not be wired through the stent struts because of the acute angle.  The right coronary artery has a mid lesion of 50%.    Last LDL from Nov 2021 was 52  Was seen by Gillian Shields in 2022-08-22 Was started on Isosorbide Losartan was reduced to 50 mg a day   Still has chest pain  Cannot walk for long distances  Chest pressure , left chest pressure  Helped by stopping and taking NTG .   Unable to do his job because of his cp and "mental foggyness" with exertion Is out of work at present  Was started on Pralulent by his primary - is not on our list   Will set him up for a cath, Increase coreg to 12.5 BID      Past Medical History:  Diagnosis Date   Bell's palsy 07/31/2013   Displacement of lumbar intervertebral disc without myelopathy    High cholesterol    Hypertension     Past Surgical History:  Procedure Laterality Date   CORONARY STENT INTERVENTION N/A 11/03/2019   Procedure: CORONARY STENT INTERVENTION;  Surgeon: Varanasi, Jayadeep S, MD;  Location: [facility] INVASIVE CV LAB;  Service: Cardiovascular;  Laterality: N/A;   INTRAVASCULAR ULTRASOUND/IVUS N/A 11/03/2019   Procedure: Intravascular Ultrasound/IVUS;  Surgeon: Varanasi, Jayadeep S, MD;  Location: [facility] INVASIVE CV LAB;  Service: Cardiovascular;  Laterality: N/A;   LEFT HEART CATH AND CORONARY ANGIOGRAPHY N/A 11/03/2019   Procedure: LEFT HEART CATH AND  CORONARY ANGIOGRAPHY;  Surgeon: Varanasi, Jayadeep S, MD;  Location: [facility] INVASIVE CV LAB;  Service: Cardiovascular;  Laterality: N/A;   WRIST SURGERY Right     Current Medications: Current Meds  Medication Sig   aspirin EC 81 MG tablet Take 81 mg by mouth daily.   atorvastatin (LIPITOR) 80 MG tablet Take 1 tablet (80 mg total) by mouth daily.   BLACK CURRANT SEED OIL PO Take 1,250 mg by mouth daily.   carvedilol (COREG) 12.5 MG tablet Take 1 tablet (12.5 mg total) by mouth 2 (two) times daily.   cetirizine (ZYRTEC) 10 MG tablet Take 10 mg by mouth daily.   Cholecalciferol (VITAMIN D-3) 125 MCG (5000 UT) TABS Take 1 tablet by mouth with breakfast, with lunch, and with evening meal.   clopidogrel (PLAVIX) 75 MG tablet Take 1 tablet (75 mg total) by mouth daily.   FLUoxetine (PROZAC) 10 MG capsule Take 10 mg by mouth daily.    gabapentin (NEURONTIN) 400 MG capsule Take 400 mg by mouth 2 (two) times daily as needed (pain).    ibuprofen (ADVIL) 800 MG tablet Take 800 mg by mouth 3 (three) times daily as needed for moderate pain.    isosorbide mononitrate (IMDUR) 30 MG 24 hr tablet Take 1 tablet (30 mg total) by mouth daily.   losartan (COZAAR) 50 MG tablet Take 1 tablet (50 mg total) by mouth daily.   nitroGLYCERIN (NITROSTAT) 0.3 MG SL tablet Place 1 tablet under the tongue every 5 (five) minutes as needed for chest pain.    OIL OF OREGANO PO Take 150 mg by mouth daily.   TURMERIC PO Take 120 mg by mouth. Per patient taking 1 tablet twice a day   [DISCONTINUED] carvedilol (COREG) 6.25 MG tablet Take 1 tablet (6.25 mg total) by mouth 2 (two) times daily.     Allergies:   Patient has no known allergies.   Social History   Socioeconomic History   Marital status: Married    Spouse name: Not on file   Number of children: Not on file   Years of education: Not on file   Highest education level: Not on file  Occupational History   Not on file  Tobacco Use   Smoking status: Former    Smokeless tobacco: Never  Substance and Sexual Activity   Alcohol use: Yes    Comment: occasionally    Drug use: No   Sexual activity: Not on file  Other Topics Concern   Not on file  Social History Narrative   Not on file   Social Determinants of Health   Financial Resource Strain: Not   on file  Food Insecurity: Not on file  Transportation Needs: Not on file  Physical Activity: Not on file  Stress: Not on file  Social Connections: Not on file     Family History: The patient's family history includes Diabetes in an other family member; Glaucoma in an other family member; High Cholesterol in an other family member; Hypertension in an other family member; Stroke in an other family member.  ROS:   Please see the history of present illness.     All other systems reviewed and are negative.  EKGs/Labs/Other Studies Reviewed:    The following studies were reviewed today:   Recent Labs: 01/13/2020: ALT 29 06/30/2020: BUN 18; Creatinine, Ser 0.99; Hemoglobin 15.1; Platelets 257; Potassium 4.1; Sodium 141  Recent Lipid Panel    Component Value Date/Time   CHOL 114 01/13/2020 1013   TRIG 102 01/13/2020 1013   HDL 43 01/13/2020 1013   CHOLHDL 2.7 01/13/2020 1013   LDLCALC 52 01/13/2020 1013    Physical Exam:     Physical Exam: Blood pressure 124/74, pulse 79, height 6' (1.829 m), weight 214 lb 3.2 oz (97.2 kg), SpO2 97 %.  GEN:  Well nourished, well developed in no acute distress HEENT: Normal NECK: No JVD; No carotid bruits LYMPHATICS: No lymphadenopathy CARDIAC: RRR   RESPIRATORY:  Clear to auscultation without rales, wheezing or rhonchi  ABDOMEN: Soft, non-tender, non-distended MUSCULOSKELETAL:  No edema; No deformity  SKIN: Warm and dry NEUROLOGIC:  Alert and oriented x 3    EKG:        ASSESSMENT:    1. Coronary artery disease of native artery of native heart with stable angina pectoris (HCC)   2. Essential hypertension   3. Chest pain, unspecified type        PLAN:      1.  CAD -    , he had stenting of his mid LAD.  It appears that he had plaque shift and perhaps worsening stenosis in the distal LAD.  This might be the source of his angina.  He also has a stenosis in the second diagonal vessel.  He had a Myoview study which I have personally reviewed.  There is no clear-cut evidence of ischemia.  Based on his recurrent symptoms and the fact that he cannot return to work because of his exertional angina, I think that we need to proceed with repeat heart catheterization.  We discussed the risk, benefits, options of heart catheterization.  He understands and agrees to proceed.   2.  Hyperlipidemia:   his primary MD has placed him on Pralulent. Its not on our list. He will discuss with her tomorrow if this is to be an ongoing medication   3.   Carotid calcifications:    mild carotid disease by duplex .  Repeat PRN    Medication Adjustments/Labs and Tests Ordered: Current medicines are reviewed at length with the patient today.  Concerns regarding medicines are outlined above.  Orders Placed This Encounter  Procedures   Basic metabolic panel   CBC    Meds ordered this encounter  Medications   carvedilol (COREG) 12.5 MG tablet    Sig: Take 1 tablet (12.5 mg total) by mouth 2 (two) times daily.    Dispense:  180 tablet    Refill:  3      Patient Instructions  Medication Instructions:  Your physician has recommended you make the following change in your medication: 1) INCREASE Coreg (carvedilol) to 12.5mg  twice daily  *  If you need a refill on your cardiac medications before your next appointment, please call your pharmacy*   Testing/Procedures: Your physician has requested that you have a cardiac catheterization. Cardiac catheterization is used to diagnose and/or treat various heart conditions. Doctors may recommend this procedure for a number of different reasons. The most common reason is to evaluate chest pain. Chest pain can  be a symptom of coronary artery disease (CAD), and cardiac catheterization can show whether plaque is narrowing or blocking your heart's arteries. This procedure is also used to evaluate the valves, as well as measure the blood flow and oxygen levels in different parts of your heart. For further information please visit [email or web address] Please follow instruction sheet, as given.  Follow-Up: At [facility], you and your health needs are our priority.  As part of our continuing mission to provide you with exceptional heart care, we have created designated Provider Care Teams.  These Care Teams include your primary Cardiologist (physician) and Advanced Practice Providers (APPs -  Physician Assistants and Nurse Practitioners) who all work together to provide you with the care you need, when you need it.  Other Instructions  [facility] CARDIOVASCULAR DIVISION [facility] OFFICE [address] [place] [address] Dept: [phone] Loc: [phone]  PHYLLIS WHITEFIELD  09/27/2020  You are scheduled for a Cardiac Catheterization on Wednesday, August 3 with Dr. Bryan Lemma.  1. Please arrive at the [facility] (Main Entrance A) at [facility]: [address] [place], [place] [address] at 9:30am (This time is two hours before your procedure to ensure your preparation). Free valet parking service is available.   Special note: Every effort is made to have your procedure done on time. Please understand that emergencies sometimes delay scheduled procedures.  2. Diet: Do not eat solid foods after midnight.  The patient may have clear liquids until 5am upon the day of the procedure.  3. Labs: You will need to have blood drawn on Friday, July 29 at [facility] at [facility]. [address], [place]  Open: 7:30am - 5pm   Phone: [phone]. You do not need to be fasting.  4. Medication instructions in preparation for  your procedure:   Contrast Allergy: No  On the morning of your procedure, take your Aspirin and Plavix/Clopidogrel and any morning medicines NOT listed above.  You may use sips of water.  5. Plan for one night stay--bring personal belongings. 6. Bring a current list of your medications and current insurance cards. 7. You MUST have a responsible person to drive you home. 8. Someone MUST be with you the first 24 hours after you arrive home or your discharge will be delayed. 9. Please wear clothes that are easy to get on and off and wear slip-on shoes.  Thank you for allowing [place] to care for you!   -- [facility] Invasive Cardiovascular services   Signed, Kristeen Miss, MD  09/27/2020 6:07 PM     [facility]

## 2020-09-26 NOTE — Progress Notes (Signed)
Cardiology Office Note:    Date:  09/27/2020   ID:  John [name], DOB 1960/12/24, MRN [id number]  PCP:  Armando Gang, FNP  Cardiologist:  Caly Pellum  Electrophysiologist:  None   Referring MD: Armando Gang, FNP   Chief Complaint  Patient presents with   Coronary Artery Disease    Previous notes:      John [name] is a 60 y.o. male with a hx of hypertension and hyperlipidemia.  He is seen today for further evaluation of some exertional chest discomfort.  We are asked to see him today by Dr. Welton Flakes  for further evaluation of this exertional chest pain.  Started having exertional CP while push mowing his yard in 2019. Would have to stop and rest. Later , he noticed CP while climbing the [place].  CP was achey,  Would last several minutes.  Would have to stop and relax .  Associated with sweats and dyspnea.   No dizziness  Is having to limit his exercise because of this cp .   Was in the hospital at [facility] with cp.   Does not recall whether or not he had a stress test.    October 07, 2019:  John [name] is seen today for follow-up visit.  We saw him several months ago he was having episodes of chest discomfort.  We discussed heart catheterization and also discussed stress testing.  We schedule him for stress test but he never test done.  His sone passed away in 08/22/22 and did not make it to the stress appt.  Still has some CP.  Occurs with heavy lifting and stress.  Pains last for a minute or so .  He has been having exertional chest pain for the past year or so.  They seem to be progressing.  January 13, 2020: John [name] is seen back today for follow-up of his coronary artery disease.  He status post stenting of his mid LAD.  There was a second diagonal stenosis that could not be wired because of the previously placed LAD stent.  CP is much better,  Has some doe on occasion .   Jan. 21, 2022: John [name] is seen today for work in visit.  He was recently at the  dentist and was noted to have carotid calcifications on xray . 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  Follow-up as needed.   Heart catheterization November 03, 2019 shows that his mid LAD is occluded after the second diagonal.  He has right to left collaterals.  He has a second diagonal vessel that has an 80% stenosis.  This could not be wired through the stent struts because of the acute angle.  The right coronary artery has a mid lesion of 50%.    Last LDL from Nov 2021 was 52  Was seen by John [name] in 2022-08-22 Was started on Isosorbide Losartan was reduced to 50 mg a day   Still has chest pain  Cannot walk for long distances  Chest pressure , left chest pressure  Helped by stopping and taking NTG .   Unable to do his job because of his cp and "mental foggyness" with exertion Is out of work at present  Was started on Pralulent by his primary - is not on our list   Will set him up for a cath, Increase coreg to 12.5 BID      Past Medical History:  Diagnosis Date   Bell's palsy 07/31/2013   Displacement of lumbar intervertebral disc without myelopathy    High cholesterol    Hypertension     Past Surgical History:  Procedure Laterality Date   CORONARY STENT INTERVENTION N/A 11/03/2019   Procedure: CORONARY STENT INTERVENTION;  Surgeon: Corky CraftsVaranasi, Jayadeep S, MD;  [facility];  Service: Cardiovascular;  Laterality: N/A;   INTRAVASCULAR ULTRASOUND/IVUS N/A 11/03/2019   Procedure: Intravascular Ultrasound/IVUS;  Surgeon: Corky CraftsVaranasi, Jayadeep S, MD;  [facility];  Service: Cardiovascular;  Laterality: N/A;   LEFT HEART CATH AND CORONARY ANGIOGRAPHY N/A 11/03/2019   Procedure: LEFT HEART CATH AND  CORONARY ANGIOGRAPHY;  Surgeon: Corky CraftsVaranasi, Jayadeep S, MD;  [facility];  Service: Cardiovascular;  Laterality: N/A;   WRIST SURGERY Right     Current Medications: Current Meds  Medication Sig   aspirin EC 81 MG tablet Take 81 mg by mouth daily.   atorvastatin (LIPITOR) 80 MG tablet Take 1 tablet (80 mg total) by mouth daily.   BLACK CURRANT SEED OIL PO Take 1,250 mg by mouth daily.   carvedilol (COREG) 12.5 MG tablet Take 1 tablet (12.5 mg total) by mouth 2 (two) times daily.   cetirizine (ZYRTEC) 10 MG tablet Take 10 mg by mouth daily.   Cholecalciferol (VITAMIN D-3) 125 MCG (5000 UT) TABS Take 1 tablet by mouth with breakfast, with lunch, and with evening meal.   clopidogrel (PLAVIX) 75 MG tablet Take 1 tablet (75 mg total) by mouth daily.   FLUoxetine (PROZAC) 10 MG capsule Take 10 mg by mouth daily.    gabapentin (NEURONTIN) 400 MG capsule Take 400 mg by mouth 2 (two) times daily as needed (pain).    ibuprofen (ADVIL) 800 MG tablet Take 800 mg by mouth 3 (three) times daily as needed for moderate pain.    isosorbide mononitrate (IMDUR) 30 MG 24 hr tablet Take 1 tablet (30 mg total) by mouth daily.   losartan (COZAAR) 50 MG tablet Take 1 tablet (50 mg total) by mouth daily.   nitroGLYCERIN (NITROSTAT) 0.3 MG SL tablet Place 1 tablet under the tongue every 5 (five) minutes as needed for chest pain.    OIL OF OREGANO PO Take 150 mg by mouth daily.   TURMERIC PO Take 120 mg by mouth. Per patient taking 1 tablet twice a day   [DISCONTINUED] carvedilol (COREG) 6.25 MG tablet Take 1 tablet (6.25 mg total) by mouth 2 (two) times daily.     Allergies:   Patient has no known allergies.   Social History   Socioeconomic History   Marital status: Married    Spouse name: Not on file   Number of children: Not on file   Years of education: Not on file   Highest education level: Not on file  Occupational History   Not on file  Tobacco Use   Smoking status: Former    Smokeless tobacco: Never  Substance and Sexual Activity   Alcohol use: Yes    Comment: occasionally    Drug use: No   Sexual activity: Not on file  Other Topics Concern   Not on file  Social History Narrative   Not on file   Social Determinants of Health   Financial Resource Strain: Not  on file  Food Insecurity: Not on file  Transportation Needs: Not on file  Physical Activity: Not on file  Stress: Not on file  Social Connections: Not on file     Family History: The patient's family history includes Diabetes in an other family member; Glaucoma in an other family member; High Cholesterol in an other family member; Hypertension in an other family member; Stroke in an other family member.  ROS:   Please see the history of present illness.     All other systems reviewed and are negative.  EKGs/Labs/Other Studies Reviewed:    The following studies were reviewed today:   Recent Labs: 01/13/2020: ALT 29 06/30/2020: BUN 18; Creatinine, Ser 0.99; Hemoglobin 15.1; Platelets 257; Potassium 4.1; Sodium 141  Recent Lipid Panel    Component Value Date/Time   CHOL 114 01/13/2020 1013   TRIG 102 01/13/2020 1013   HDL 43 01/13/2020 1013   CHOLHDL 2.7 01/13/2020 1013   LDLCALC 52 01/13/2020 1013    Physical Exam:     Physical Exam: Blood pressure 124/74, pulse 79, height 6' (1.829 m), weight 214 lb 3.2 oz (97.2 kg), SpO2 97 %.  GEN:  Well nourished, well developed in no acute distress HEENT: Normal NECK: No JVD; No carotid bruits LYMPHATICS: No lymphadenopathy CARDIAC: RRR   RESPIRATORY:  Clear to auscultation without rales, wheezing or rhonchi  ABDOMEN: Soft, non-tender, non-distended MUSCULOSKELETAL:  No edema; No deformity  SKIN: Warm and dry NEUROLOGIC:  Alert and oriented x 3    EKG:        ASSESSMENT:    1. Coronary artery disease of native artery of native heart with stable angina pectoris (HCC)   2. Essential hypertension   3. Chest pain, unspecified type        PLAN:      1.  CAD -    , he had stenting of his mid LAD.  It appears that he had plaque shift and perhaps worsening stenosis in the distal LAD.  This might be the source of his angina.  He also has a stenosis in the second diagonal vessel.  He had a Myoview study which I have personally reviewed.  There is no clear-cut evidence of ischemia.  Based on his recurrent symptoms and the fact that he cannot return to work because of his exertional angina, I think that we need to proceed with repeat heart catheterization.  We discussed the risk, benefits, options of heart catheterization.  He understands and agrees to proceed.   2.  Hyperlipidemia:   his primary MD has placed him on Pralulent. Its not on our list. He will discuss with her tomorrow if this is to be an ongoing medication   3.   Carotid calcifications:    mild carotid disease by duplex .  Repeat PRN    Medication Adjustments/Labs and Tests Ordered: Current medicines are reviewed at length with the patient today.  Concerns regarding medicines are outlined above.  Orders Placed This Encounter  Procedures   Basic metabolic panel   CBC    Meds ordered this encounter  Medications   carvedilol (COREG) 12.5 MG tablet    Sig: Take 1 tablet (12.5 mg total) by mouth 2 (two) times daily.    Dispense:  180 tablet    Refill:  3      Patient Instructions  Medication Instructions:  Your physician has recommended you make the following change in your medication: 1) INCREASE Coreg (carvedilol) to 12.5mg  twice daily  *  If you need a refill on your cardiac medications before your next appointment, please call your pharmacy*   Testing/Procedures: Your physician has requested that you have a cardiac catheterization. Cardiac catheterization is used to diagnose and/or treat various heart conditions. Doctors may recommend this procedure for a number of different reasons. The most common reason is to evaluate chest pain. Chest pain can  be a symptom of coronary artery disease (CAD), and cardiac catheterization can show whether plaque is narrowing or blocking your heart's arteries. This procedure is also used to evaluate the valves, as well as measure the blood flow and oxygen levels in different parts of your heart. For further information please visit [email or web address] Please follow instruction sheet, as given.  Follow-Up: At [facility], you and your health needs are our priority.  As part of our continuing mission to provide you with exceptional heart care, we have created designated Provider Care Teams.  These Care Teams include your primary Cardiologist (physician) and Advanced Practice Providers (APPs -  Physician Assistants and Nurse Practitioners) who all work together to provide you with the care you need, when you need it.  Other Instructions  [place] [facility] CARDIOVASCULAR DIVISION [facility] OFFICE [address] [place] [address] Dept: [phone] Loc: [phone]  PHYLLIS WHITEFIELD  09/27/2020  You are scheduled for a Cardiac Catheterization on Wednesday, August 3 with Dr. Bryan Lemma.  1. Please arrive at the [facility] (Main Entrance A) at [facility]: [address] [place], [place] [address] at 9:30am (This time is two hours before your procedure to ensure your preparation). Free valet parking service is available.   Special note: Every effort is made to have your procedure done on time. Please understand that emergencies sometimes delay scheduled procedures.  2. Diet: Do not eat solid foods after midnight.  The patient may have clear liquids until 5am upon the day of the procedure.  3. Labs: You will need to have blood drawn on Friday, July 29 at [facility] at [facility]. [address], [place]  Open: 7:30am - 5pm   Phone: [phone]. You do not need to be fasting.  4. Medication instructions in preparation for  your procedure:   Contrast Allergy: No  On the morning of your procedure, take your Aspirin and Plavix/Clopidogrel and any morning medicines NOT listed above.  You may use sips of water.  5. Plan for one night stay--bring personal belongings. 6. Bring a current list of your medications and current insurance cards. 7. You MUST have a responsible person to drive you home. 8. Someone MUST be with you the first 24 hours after you arrive home or your discharge will be delayed. 9. Please wear clothes that are easy to get on and off and wear slip-on shoes.  Thank you for allowing [place] to care for you!   -- [facility] Invasive Cardiovascular services   Signed, Kristeen Miss, MD  09/27/2020 6:07 PM     [facility]

## 2020-09-27 ENCOUNTER — Other Ambulatory Visit: Payer: Self-pay

## 2020-09-27 ENCOUNTER — Encounter: Payer: Self-pay | Admitting: Cardiovascular Disease

## 2020-09-27 ENCOUNTER — Ambulatory Visit (INDEPENDENT_AMBULATORY_CARE_PROVIDER_SITE_OTHER): Payer: Managed Care, Other (non HMO) | Admitting: Cardiovascular Disease

## 2020-09-27 VITALS — BP 124/74 | HR 79 | Ht 72.0 in | Wt 214.2 lb

## 2020-09-27 DIAGNOSIS — I10 Essential (primary) hypertension: Secondary | ICD-10-CM | POA: Diagnosis not present

## 2020-09-27 DIAGNOSIS — R079 Chest pain, unspecified: Secondary | ICD-10-CM | POA: Diagnosis not present

## 2020-09-27 DIAGNOSIS — I25118 Atherosclerotic heart disease of native coronary artery with other forms of angina pectoris: Secondary | ICD-10-CM | POA: Diagnosis not present

## 2020-09-27 MED ORDER — CARVEDILOL 12.5 MG PO TABS: 12.5000 mg | ORAL_TABLET | Freq: Two times a day (BID) | ORAL | 3 refills | Status: DC

## 2020-09-27 NOTE — Patient Instructions (Signed)
Medication Instructions:  Your physician has recommended you make the following change in your medication: 1) INCREASE Coreg (carvedilol) to 12.5mg  twice daily  *If you need a refill on your cardiac medications before your next appointment, please call your pharmacy*   Testing/Procedures: Your physician has requested that you have a cardiac catheterization. Cardiac catheterization is used to diagnose and/or treat various heart conditions. Doctors may recommend this procedure for a number of different reasons. The most common reason is to evaluate chest pain. Chest pain can be a symptom of coronary artery disease (CAD), and cardiac catheterization can show whether plaque is narrowing or blocking your heart's arteries. This procedure is also used to evaluate the valves, as well as measure the blood flow and oxygen levels in different parts of your heart. For further information please visit [email or web address] Please follow instruction sheet, as given.  Follow-Up: At [facility], you and your health needs are our priority.  As part of our continuing mission to provide you with exceptional heart care, we have created designated Provider Care Teams.  These Care Teams include your primary Cardiologist (physician) and Advanced Practice Providers (APPs -  Physician Assistants and Nurse Practitioners) who all work together to provide you with the care you need, when you need it.  Other Instructions  [place] [facility] CARDIOVASCULAR DIVISION [facility] OFFICE [name] [place] [address] Dept: [phone] Loc: [phone]  John [name]  09/27/2020  You are scheduled for a Cardiac Catheterization on Wednesday, August 3 with Dr. Bryan Lemma.  1. Please arrive at the [facility] (Main Entrance A) at [facility]: [address] [place], [place] [address] at 9:30am (This time is two hours before your procedure to ensure your  preparation). Free valet parking service is available.   Special note: Every effort is made to have your procedure done on time. Please understand that emergencies sometimes delay scheduled procedures.  2. Diet: Do not eat solid foods after midnight.  The patient may have clear liquids until 5am upon the day of the procedure.  3. Labs: You will need to have blood drawn on Friday, July 29 at [facility] at [facility]. [address], [place]  Open: 7:30am - 5pm   Phone: [phone]. You do not need to be fasting.  4. Medication instructions in preparation for your procedure:   Contrast Allergy: No  On the morning of your procedure, take your Aspirin and Plavix/Clopidogrel and any morning medicines NOT listed above.  You may use sips of water.  5. Plan for one night stay--bring personal belongings. 6. Bring a current list of your medications and current insurance cards. 7. You MUST have a responsible person to drive you home. 8. Someone MUST be with you the first 24 hours after you arrive home or your discharge will be delayed. 9. Please wear clothes that are easy to get on and off and wear slip-on shoes.  Thank you for allowing [place] to care for you!   -- [facility] In[facility] Cardiovascular services

## 2020-09-30 ENCOUNTER — Telehealth: Payer: Self-pay | Admitting: Cardiovascular Disease

## 2020-09-30 NOTE — Telephone Encounter (Signed)
HIM called and spoke to patient to inform him of the form process, $29 fee, and authorization needed to complete the form.  AO 09/30/20

## 2020-10-01 ENCOUNTER — Other Ambulatory Visit: Payer: Managed Care, Other (non HMO) | Admitting: *Deleted

## 2020-10-01 ENCOUNTER — Other Ambulatory Visit: Payer: Self-pay

## 2020-10-01 DIAGNOSIS — I10 Essential (primary) hypertension: Secondary | ICD-10-CM

## 2020-10-01 DIAGNOSIS — I25118 Atherosclerotic heart disease of native coronary artery with other forms of angina pectoris: Secondary | ICD-10-CM

## 2020-10-01 DIAGNOSIS — R079 Chest pain, unspecified: Secondary | ICD-10-CM

## 2020-10-02 LAB — CBC
Hematocrit: 43.7 % (ref 37.5–51.0)
Hemoglobin: 15.1 g/dL (ref 13.0–17.7)
MCH: 31.1 pg (ref 26.6–33.0)
MCHC: 34.6 g/dL (ref 31.5–35.7)
MCV: 90 fL (ref 79–97)
Platelets: 245 10*3/uL (ref 150–450)
RBC: 4.85 x10E6/uL (ref 4.14–5.80)
RDW: 12.6 % (ref 11.6–15.4)
WBC: 3.4 10*3/uL (ref 3.4–10.8)

## 2020-10-02 LAB — BASIC METABOLIC PANEL
BUN/Creatinine Ratio: 16 (ref 10–24)
BUN: 14 mg/dL (ref 8–27)
CO2: 24 mmol/L (ref 20–29)
Calcium: 9.1 mg/dL (ref 8.6–10.2)
Chloride: 104 mmol/L (ref 96–106)
Creatinine, Ser: 0.85 mg/dL (ref 0.76–1.27)
Glucose: 105 mg/dL — ABNORMAL HIGH (ref 65–99)
Potassium: 4.4 mmol/L (ref 3.5–5.2)
Sodium: 146 mmol/L — ABNORMAL HIGH (ref 134–144)
eGFR: 99 mL/min/{1.73_m2} (ref >59)

## 2020-10-04 ENCOUNTER — Telehealth: Payer: Self-pay | Admitting: *Deleted

## 2020-10-04 NOTE — Telephone Encounter (Signed)
Cardiac catheterization scheduled at [facility] for: Wednesday October 06, 2020 11:30 AM Arrive [facility] Main Entrance A [facility]) at: 9:30 AM   No solid food after midnight prior to cath, clear liquids until 5 AM day of procedure.   AM meds can be  taken pre-cath with sips of water including:   aspirin 81 mg   Plavix 75 mg   Confirmed patient has responsible adult to drive home post procedure and be with patient first 24 hours after arriving home.  Patients are allowed one visitor in the waiting room during the time they are at the hospital for their procedure. Both patient and visitor must wear a mask once they enter the hospital.   Patient reports does not currently have any symptoms concerning for COVID-19 and no household members with COVID-19 like illness.        Reviewed procedure/mask/visitor instructions with patient.

## 2020-10-05 ENCOUNTER — Telehealth: Payer: Self-pay | Admitting: Cardiology

## 2020-10-05 NOTE — Telephone Encounter (Signed)
Pt is stating that he was around someone with covid recently.. pt states that he had on a mask and was 6 ft apart but does have a scratchy throat, would like to know if he should keep his appt.. or get tested before appt... please advise

## 2020-10-05 NOTE — Telephone Encounter (Signed)
Called the patient back. He stated that he is scheduled for a cardiac cath tomorrow with Dr. Herbie Baltimore.   He wanted to call and let him know that he was exposed to covid yesterday. Both his friend and himself wore masks and they were over 6 feet apart. The friend was on his last day of quarantine.   He wants to know if he needs to be tested or the cath delayed.  He denies any symptoms currently.   Patient has been made aware that he should be fine to proceed with the cardiac cath tomorrow. The patient has spoken with his primary cardiologist about this and it is okay to proceed. Patient is aware.

## 2020-10-06 ENCOUNTER — Ambulatory Visit (HOSPITAL_COMMUNITY)
Admission: RE | Disposition: A | Discharge status: Home / Self Care | Payer: Self-pay | Source: Home / Self Care | Attending: Cardiology

## 2020-10-06 ENCOUNTER — Ambulatory Visit (HOSPITAL_COMMUNITY)
Admission: RE | Admit: 2020-10-06 | Discharge: 2020-10-06 | Disposition: A | Discharge status: Home / Self Care | Payer: Managed Care, Other (non HMO) | Attending: Cardiology | Admitting: Cardiology

## 2020-10-06 ENCOUNTER — Other Ambulatory Visit: Payer: Self-pay

## 2020-10-06 ENCOUNTER — Encounter (HOSPITAL_COMMUNITY): Payer: Self-pay | Admitting: Cardiology

## 2020-10-06 DIAGNOSIS — E7800 Pure hypercholesterolemia, unspecified: Secondary | ICD-10-CM | POA: Insufficient documentation

## 2020-10-06 DIAGNOSIS — Z87891 Personal history of nicotine dependence: Secondary | ICD-10-CM | POA: Diagnosis not present

## 2020-10-06 DIAGNOSIS — G510 Bell's palsy: Secondary | ICD-10-CM | POA: Diagnosis not present

## 2020-10-06 DIAGNOSIS — Z7902 Long term (current) use of antithrombotics/antiplatelets: Secondary | ICD-10-CM | POA: Insufficient documentation

## 2020-10-06 DIAGNOSIS — I25118 Atherosclerotic heart disease of native coronary artery with other forms of angina pectoris: Secondary | ICD-10-CM | POA: Diagnosis not present

## 2020-10-06 DIAGNOSIS — Z8249 Family history of ischemic heart disease and other diseases of the circulatory system: Secondary | ICD-10-CM | POA: Diagnosis not present

## 2020-10-06 DIAGNOSIS — Z79899 Other long term (current) drug therapy: Secondary | ICD-10-CM | POA: Diagnosis not present

## 2020-10-06 DIAGNOSIS — Z955 Presence of coronary angioplasty implant and graft: Secondary | ICD-10-CM | POA: Diagnosis not present

## 2020-10-06 DIAGNOSIS — Z7982 Long term (current) use of aspirin: Secondary | ICD-10-CM | POA: Diagnosis not present

## 2020-10-06 DIAGNOSIS — I209 Angina pectoris, unspecified: Secondary | ICD-10-CM | POA: Clinically undetermined

## 2020-10-06 DIAGNOSIS — I10 Essential (primary) hypertension: Secondary | ICD-10-CM | POA: Diagnosis not present

## 2020-10-06 DIAGNOSIS — I779 Disorder of arteries and arterioles, unspecified: Secondary | ICD-10-CM | POA: Insufficient documentation

## 2020-10-06 DIAGNOSIS — I2510 Atherosclerotic heart disease of native coronary artery without angina pectoris: Secondary | ICD-10-CM

## 2020-10-06 HISTORY — PX: LEFT HEART CATH AND CORONARY ANGIOGRAPHY: CATH118249

## 2020-10-06 HISTORY — PX: INTRAVASCULAR PRESSURE WIRE/FFR STUDY: CATH118243

## 2020-10-06 LAB — POCT ACTIVATED CLOTTING TIME: Activated Clotting Time: 318 seconds

## 2020-10-06 SURGERY — LEFT HEART CATH AND CORONARY ANGIOGRAPHY
Anesthesia: LOCAL

## 2020-10-06 MED ORDER — MIDAZOLAM HCL 2 MG/2ML IJ SOLN
INTRAMUSCULAR | Status: AC
  Filled 2020-10-06: qty 2

## 2020-10-06 MED ORDER — MIDAZOLAM HCL 2 MG/2ML IJ SOLN
INTRAMUSCULAR | Status: DC | PRN
  Administered 2020-10-06: 2 mg via INTRAVENOUS

## 2020-10-06 MED ORDER — LABETALOL HCL 5 MG/ML IV SOLN: 10.0000 mg | INTRAVENOUS | Status: DC | PRN

## 2020-10-06 MED ORDER — HEPARIN SODIUM (PORCINE) 1000 UNIT/ML IJ SOLN
INTRAMUSCULAR | Status: DC | PRN
  Administered 2020-10-06 (×2): 5000 [IU] via INTRAVENOUS

## 2020-10-06 MED ORDER — SODIUM CHLORIDE 0.9 % IV SOLN: INTRAVENOUS | Status: AC

## 2020-10-06 MED ORDER — LIDOCAINE HCL (PF) 1 % IJ SOLN
INTRAMUSCULAR | Status: DC | PRN
  Administered 2020-10-06: 2 mL via INTRADERMAL

## 2020-10-06 MED ORDER — VERAPAMIL HCL 2.5 MG/ML IV SOLN
INTRAVENOUS | Status: DC | PRN
  Administered 2020-10-06: 10 mL via INTRA_ARTERIAL

## 2020-10-06 MED ORDER — LIDOCAINE HCL (PF) 1 % IJ SOLN
INTRAMUSCULAR | Status: AC
  Filled 2020-10-06: qty 30

## 2020-10-06 MED ORDER — SODIUM CHLORIDE 0.9 % WEIGHT BASED INFUSION
3.0000 mL/kg/h | INTRAVENOUS | Status: DC
  Administered 2020-10-06: 3 mL/kg/h via INTRAVENOUS

## 2020-10-06 MED ORDER — HEPARIN (PORCINE) IN NACL 1000-0.9 UT/500ML-% IV SOLN
INTRAVENOUS | Status: AC
  Filled 2020-10-06: qty 500

## 2020-10-06 MED ORDER — VERAPAMIL HCL 2.5 MG/ML IV SOLN
INTRAVENOUS | Status: AC
  Filled 2020-10-06: qty 2

## 2020-10-06 MED ORDER — SODIUM CHLORIDE 0.9% FLUSH: 3.0000 mL | INTRAVENOUS | Status: DC | PRN

## 2020-10-06 MED ORDER — ACETAMINOPHEN 325 MG PO TABS: 650.0000 mg | ORAL_TABLET | ORAL | Status: DC | PRN

## 2020-10-06 MED ORDER — IOHEXOL 350 MG/ML SOLN
INTRAVENOUS | Status: DC | PRN
  Administered 2020-10-06: 85 mL

## 2020-10-06 MED ORDER — ASPIRIN 81 MG PO CHEW: 81.0000 mg | CHEWABLE_TABLET | ORAL | Status: DC

## 2020-10-06 MED ORDER — SODIUM CHLORIDE 0.9 % WEIGHT BASED INFUSION: 1.0000 mL/kg/h | INTRAVENOUS | Status: DC

## 2020-10-06 MED ORDER — SODIUM CHLORIDE 0.9% FLUSH: 3.0000 mL | Freq: Two times a day (BID) | INTRAVENOUS | Status: DC

## 2020-10-06 MED ORDER — ONDANSETRON HCL 4 MG/2ML IJ SOLN: 4.0000 mg | Freq: Four times a day (QID) | INTRAMUSCULAR | Status: DC | PRN

## 2020-10-06 MED ORDER — HEPARIN (PORCINE) IN NACL 1000-0.9 UT/500ML-% IV SOLN
INTRAVENOUS | Status: DC | PRN
  Administered 2020-10-06 (×3): 500 mL

## 2020-10-06 MED ORDER — FENTANYL CITRATE (PF) 100 MCG/2ML IJ SOLN
INTRAMUSCULAR | Status: DC | PRN
  Administered 2020-10-06: 25 ug via INTRAVENOUS

## 2020-10-06 MED ORDER — MORPHINE SULFATE (PF) 2 MG/ML IV SOLN: 2.0000 mg | INTRAVENOUS | Status: DC | PRN

## 2020-10-06 MED ORDER — FENTANYL CITRATE (PF) 100 MCG/2ML IJ SOLN
INTRAMUSCULAR | Status: AC
  Filled 2020-10-06: qty 2

## 2020-10-06 MED ORDER — SODIUM CHLORIDE 0.9 % IV SOLN: 250.0000 mL | INTRAVENOUS | Status: DC | PRN

## 2020-10-06 MED ORDER — HYDRALAZINE HCL 20 MG/ML IJ SOLN: 10.0000 mg | INTRAMUSCULAR | Status: DC | PRN

## 2020-10-06 SURGICAL SUPPLY — 13 items
CATH OPTITORQUE TIG 4.0 5F (CATHETERS) ×1 IMPLANT
CATH VISTA GUIDE 6FR JR4 (CATHETERS) ×1 IMPLANT
DEVICE RAD COMP TR BAND LRG (VASCULAR PRODUCTS) ×1 IMPLANT
GLIDESHEATH SLEND SS 6F .021 (SHEATH) ×1 IMPLANT
GUIDEWIRE INQWIRE 1.5J.035X260 (WIRE) IMPLANT
GUIDEWIRE PRESSURE X 175 (WIRE) ×2 IMPLANT
INQWIRE 1.5J .035X260CM (WIRE) ×2
KIT ESSENTIALS PG (KITS) ×1 IMPLANT
KIT HEART LEFT (KITS) ×2 IMPLANT
PACK CARDIAC CATHETERIZATION (CUSTOM PROCEDURE TRAY) ×2 IMPLANT
SHEATH PROBE COVER 6X72 (BAG) ×1 IMPLANT
TRANSDUCER W/STOPCOCK (MISCELLANEOUS) ×3 IMPLANT
TUBING CIL FLEX 10 FLL-RA (TUBING) ×2 IMPLANT

## 2020-10-06 NOTE — Progress Notes (Signed)
CARDIAC REHAB PHASE I   Post cath order received. Pt received no intervention, not appropriate for CRP at this time.  Reynold Bowen, RN BSN 10/06/2020 2:08 PM

## 2020-10-06 NOTE — Interval H&P Note (Signed)
History and Physical Interval Note:  10/06/2020 11:50 AM  John [name]  has presented today for surgery, with the diagnosis of angina (angina class III).  The various methods of treatment have been discussed with the patient and family. After consideration of risks, benefits and other options for treatment, the patient has consented to  Procedure(s): LEFT HEART CATH AND CORONARY ANGIOGRAPHY (N/A)  PERCUTANEOUS CORONARY INTERVENTION  as a surgical intervention.  The patient's history has been reviewed, patient examined, no change in status, stable for surgery.  I have reviewed the patient's chart and labs.  Questions were answered to the patient's satisfaction.    Cath Lab Visit (complete for each Cath Lab visit)  Clinical Evaluation Leading to the Procedure:   ACS: Yes.    Non-ACS:    Anginal Classification: CCS III  Anti-ischemic medical therapy: Maximal Therapy (2 or more classes of medications)  Non-Invasive Test Results: No non-invasive testing performed  Prior CABG: No previous CABG   Bryan Lemma

## 2020-10-07 MED FILL — Heparin Sod (Porcine)-NaCl IV Soln 1000 Unit/500ML-0.9%: INTRAVENOUS | Qty: 500 | Status: AC

## 2020-10-08 ENCOUNTER — Telehealth: Payer: Self-pay | Admitting: Nurse Practitioner

## 2020-10-08 MED ORDER — ISOSORBIDE MONONITRATE ER 60 MG PO TB24: 60.0000 mg | ORAL_TABLET | Freq: Every day | ORAL | 3 refills | Status: DC

## 2020-10-08 NOTE — Telephone Encounter (Signed)
Called patient in regards to paperwork that was received in the office regarding patient's work restrictions. I discussed his work limitations per request from Dr. Elease Hashimoto who has reviewed his cardiac cath films. 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 Dr. Elease Hashimoto has advised maximizing optimal medical therapy with close follow-up. He agrees to limit patient's work shifts to 8 hours per day and has advised no lifting > 10 lbs, no repetitive ladder or stair climbing, or squatting. I advised patient to increase his isosorbide to 60 mg daily. I scheduled him to see Dr. Elease Hashimoto on 8/23 and to monitor BP, chest pain, and any other symptoms such as dizziness or lightheadedness in the interim. I advised him to call back prior to appointment with any questions or concerns. He verbalized understanding and agreement and thanked me for the call.

## 2020-10-27 ENCOUNTER — Ambulatory Visit: Payer: Managed Care, Other (non HMO) | Admitting: Cardiovascular Disease

## 2020-10-27 ENCOUNTER — Encounter: Payer: Self-pay | Admitting: Cardiovascular Disease

## 2020-10-27 NOTE — Progress Notes (Signed)
Cardiology Office Note:    Date:  10/28/2020   ID:  John [name], DOB 03-13-1960, MRN [id number]  PCP:  Armando Gang, FNP  Cardiologist:  Jennene Downie  Electrophysiologist:  None   Referring MD: Armando Gang, FNP   Chief Complaint  Patient presents with   Coronary Artery Disease    Previous notes:      John [name] is a 60 y.o. male with a hx of hypertension and hyperlipidemia.  He is seen today for further evaluation of some exertional chest discomfort.  We are asked to see him today by Dr. Welton Flakes  for further evaluation of this exertional chest pain.  Started having exertional CP while push mowing his yard in 2019. Would have to stop and rest. Later , he noticed CP while climbing the [place].  CP was achey,  Would last several minutes.  Would have to stop and relax .  Associated with sweats and dyspnea.   No dizziness  Is having to limit his exercise because of this cp .   Was in the hospital at [facility] with cp.   Does not recall whether or not he had a stress test.    October 07, 2019:  Mr. John [name] is seen today for follow-up visit.  We saw him several months ago he was having episodes of chest discomfort.  We discussed heart catheterization and also discussed stress testing.  We schedule him for stress test but he never test done.  His sone passed away in 2022-09-20 and did not make it to the stress appt.  Still has some CP.  Occurs with heavy lifting and stress.  Pains last for a minute or so .  He has been having exertional chest pain for the past year or so.  They seem to be progressing.  January 13, 2020: John [name] is seen back today for follow-up of his coronary artery disease.  He status post stenting of his mid LAD.  There was a second diagonal stenosis that could not be wired because of the previously placed LAD stent.  CP is much better,  Has some doe on occasion .   Jan. 21, 2022: John [name] is seen today for work in visit.  He was recently at the  dentist and was noted to have carotid calcifications on xray . 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  Follow-up as needed.   Heart catheterization November 03, 2019 shows that his mid LAD is occluded after the second diagonal.  He has right to left collaterals.  He has a second diagonal vessel that has an 80% stenosis.  This could not be wired through the stent struts because of the acute angle.  The right coronary artery has a mid lesion of 50%. He had stenting across the  LAD CTO by Dr. Eldridge Dace    Last LDL from Nov 2021 was 52  Was seen by Gillian Shields in 09/20/22 Was started on Isosorbide Losartan was reduced to 50 mg a day   Still has chest pain  Cannot walk for long distances  Chest pressure , left chest pressure  Helped by stopping and taking NTG .   Unable to do his job because of his cp  and "mental foggyness" with exertion Is out of work at present  Was started on Pralulent by his primary - is not on our list   Will set him up for a cath, Increase coreg to 12.5 BID    Oct 28, 2020  Seen following cath. Personally reviewed Mid LAD stent is patent Branching diag has severe ostial disease just after branching off the LAD - would likely be a difficult PCI. Myoview in May showed no ischemia    Past Medical History:  Diagnosis Date   Bell's palsy 07/31/2013   Displacement of lumbar intervertebral disc without myelopathy    High cholesterol    Hypertension     Past Surgical History:  Procedure Laterality Date   CORONARY STENT INTERVENTION N/A 11/03/2019   Procedure: CORONARY STENT INTERVENTION;  Surgeon: Corky Crafts, MD;  Location: [facility] INVASIVE CV LAB;  Service: Cardiovascular;  Laterality: N/A;   INTRAVASCULAR  PRESSURE WIRE/FFR STUDY N/A 10/06/2020   Procedure: INTRAVASCULAR PRESSURE WIRE/FFR STUDY;  Surgeon: Marykay Lex, MD;  Location: [facility] INVASIVE CV LAB;  Service: Cardiovascular;  Laterality: N/A;   INTRAVASCULAR ULTRASOUND/IVUS N/A 11/03/2019   Procedure: Intravascular Ultrasound/IVUS;  Surgeon: Corky Crafts, MD;  Location: [facility] INVAS[facility] CV LAB;  Service: Cardiovascular;  Laterality: N/A;   LEFT HEART CATH AND CORONARY ANGIOGRAPHY N/A 11/03/2019   Procedure: LEFT HEART CATH AND CORONARY ANGIOGRAPHY;  Surgeon: Corky Crafts, MD;  Location: [facility] INVASIVE CV LAB;  Service: Cardiovascular;  Laterality: N/A;   LEFT HEART CATH AND CORONARY ANGIOGRAPHY N/A 10/06/2020   Procedure: LEFT HEART CATH AND CORONARY ANGIOGRAPHY;  Surgeon: Marykay Lex, MD;  Location: [facility] INVASIVE CV LAB;  Service: Cardiovascular;  Laterality: N/A;   WRIST SURGERY Right     Current Medications: Current Meds  Medication Sig   albuterol (VENTOLIN HFA) 108 (90 Base) MCG/ACT inhaler Inhale 1-2 puffs into the lungs every 6 (six) hours as needed for wheezing or shortness of breath.   aspirin EC 81 MG tablet Take 81 mg by mouth in the morning.   atorvastatin (LIPITOR) 80 MG tablet Take 1 tablet (80 mg total) by mouth daily.   BLACK CURRANT SEED OIL PO Take 1,250 mg by mouth in the morning and at bedtime.   carvedilol (COREG) 12.5 MG tablet Take 1 tablet (12.5 mg total) by mouth 2 (two) times daily.   cetirizine (ZYRTEC) 10 MG tablet Take 10 mg by mouth in the morning.   Cholecalciferol (VITAMIN D-3) 125 MCG (5000 UT) TABS Take 15,000 Units by mouth in the morning and at bedtime.   clopidogrel (PLAVIX) 75 MG tablet Take 1 tablet (75 mg total) by mouth daily.   Evolocumab (REPATHA) 140 MG/ML SOSY Inject 140 mg into the skin every Tuesday.   FLUoxetine (PROZAC) 10 MG capsule Take 10 mg by mouth once a week. At bedtime   gabapentin (NEURONTIN) 400 MG capsule Take 400 mg by mouth 2 (two) times daily as needed (pain).     ibuprofen (ADVIL) 800 MG tablet Take 800 mg by mouth 3 (three) times daily as needed (pain.).   isosorbide mononitrate (IMDUR) 60 MG 24 hr tablet Take 1 tablet (60 mg total) by mouth daily.   losartan (COZAAR) 100 MG tablet Take 1 tablet (100 mg total) by mouth daily.   nitroGLYCERIN (NITROSTAT) 0.3 MG SL tablet Place 1 tablet under the tongue every 5 (five) minutes x 3 doses as needed for chest pain.   OIL OF OREGANO PO Take 150 mg by mouth  in the morning and at bedtime.   Probiotic Product (ACIDOPHILUS, PROBIOTIC, PO) Take 1 capsule by mouth every evening.   Pseudoephedrine-Naproxen Na (ALEVE-D SINUS & COLD) 120-220 MG TB12 Take 1 tablet by mouth daily as needed (sinus congestion.).   ranolazine (RANEXA) 500 MG 12 hr tablet Take 1 tablet (500 mg total) by mouth 2 (two) times daily.   TURMERIC PO Take 1 capsule by mouth in the morning and at bedtime. Turmeric Supreme   [DISCONTINUED] losartan (COZAAR) 50 MG tablet Take 1 tablet (50 mg total) by mouth daily.     Allergies:   No healthtouch food allergies   Social History   Socioeconomic History   Marital status: Married    Spouse name: Not on file   Number of children: Not on file   Years of education: Not on file   Highest education level: Not on file  Occupational History   Not on file  Tobacco Use   Smoking status: Former   Smokeless tobacco: Never  Substance and Sexual Activity   Alcohol use: Yes    Comment: occasionally    Drug use: No   Sexual activity: Not on file  Other Topics Concern   Not on file  Social History Narrative   Not on file   Social Determinants of Health   Financial Resource Strain: Not on file  Food Insecurity: Not on file  Transportation Needs: Not on file  Physical Activity: Not on file  Stress: Not on file  Social Connections: Not on file     Family History: The patient's family history includes Diabetes in an other family member; Glaucoma in an other family member; High Cholesterol in an  other family member; Hypertension in an other family member; Stroke in an other family member.  ROS:   Please see the history of present illness.     All other systems reviewed and are negative.  EKGs/Labs/Other Studies Reviewed:    The following studies were reviewed today:   Recent Labs: 01/13/2020: ALT 29 10/01/2020: BUN 14; Creatinine, Ser 0.85; Hemoglobin 15.1; Platelets 245; Potassium 4.4; Sodium 146  Recent Lipid Panel    Component Value Date/Time   CHOL 114 01/13/2020 1013   TRIG 102 01/13/2020 1013   HDL 43 01/13/2020 1013   CHOLHDL 2.7 01/13/2020 1013   LDLCALC 52 01/13/2020 1013    Physical Exam:     Physical Exam: Blood pressure 138/78, pulse 65, height 6' (1.829 m), weight 217 lb (98.4 kg), SpO2 97 %.  GEN:  Well nourished, well developed in no acute distress HEENT: Normal NECK: No JVD; No carotid bruits LYMPHATICS: No lymphadenopathy CARDIAC: RRR , no murmurs, rubs, gallops RESPIRATORY:  Clear to auscultation without rales, wheezing or rhonchi  ABDOMEN: Soft, non-tender, non-distended MUSCULOSKELETAL:  No edema; No deformity  SKIN: Warm and dry NEUROLOGIC:  Alert and oriented x 3     EKG:        ASSESSMENT:    No diagnosis found.     PLAN:      1.  CAD -    his main vessels are open.  He does have moderate disease in his right coronary artery.  It is possible that his the RCA might be causing some ischemia and chest pain.  He also has several smaller to medium size diagonal vessels that are jailed from his stent.  These vessels will would be very difficult to approach from a percutaneous intervention standpoint.  Will continue with medical therapy for his angina.  He has been taking his isosorbide twice a day.  We will have him reduce his isosorbide to just once a day-60 mg.  We will add Ranexa 500 mg twice a day.  We will also increase losartan to 100 mg a day to help reduce his blood pressure.   2.  Hyperlipidemia:    Continue PCSK9.   Apparently his insurance company wants to change him from [employer] to [employer] and I told him that this was a okay substitution from my standpoint.  3.   Hypertension: We have increased his losartan to 100 mg a day.  If he still remains hypertensive, will add amlodipine 5 mg a day. He will return to see an APP in 3 months for follow-up visit and EKG.       Medication Adjustments/Labs and Tests Ordered: Current medicines are reviewed at length with the patient today.  Concerns regarding medicines are outlined above.  No orders of the defined types were placed in this encounter.   Meds ordered this encounter  Medications   losartan (COZAAR) 100 MG tablet    Sig: Take 1 tablet (100 mg total) by mouth daily.    Dispense:  90 tablet    Refill:  3    Dose change   ranolazine (RANEXA) 500 MG 12 hr tablet    Sig: Take 1 tablet (500 mg total) by mouth 2 (two) times daily.    Dispense:  60 tablet    Refill:  11       Patient Instructions  Medication Instructions:  1) Make sure you are only taking your Imdur  once daily in the morning 2) INCREASE Losartan to  once daily 3) START Ranexa  twice daily  *If you need a refill on your cardiac medications before your next appointment, please call your pharmacy*   Lab Work: None If you have labs (blood work) drawn today and your tests are completely normal, you will receive your results only by: MyChart Message (if you have MyChart) OR A paper copy in the mail If you have any lab test that is abnormal or we need to change your treatment, we will call you to review the results.   Testing/Procedures: None   Follow-Up: At [facility], you and your health needs are our priority.  As part of our continuing mission to provide you with exceptional heart care, we have created designated Provider Care Teams.  These Care Teams include your primary Cardiologist (physician) and Advanced Practice Providers (APPs -  Physician  Assistants and Nurse Practitioners) who all work together to provide you with the care you need, when you need it.  We recommend signing up for the patient portal called "MyChart".  Sign up information is provided on this After Visit Summary.  MyChart is used to connect with patients for Virtual Visits (Telemedicine).  Patients are able to view lab/test results, encounter notes, upcoming appointments, etc.  Non-urgent messages can be sent to your provider as well.   To learn more about what you can do with MyChart, go to [email or web address].    Your next appointment:   3 month(s)  The format for your next appointment:   In Person  Provider:   You will see one of the following Advanced Practice Providers on your designated Care Team:   Tereso Newcomer, PA-C [place], [place]   Other Instructions   Occupational medicine Dr. Laverle Hobby [facility]  [phone]      Signed, Kristeen Miss, MD  10/28/2020  10:57 AM    [facility]

## 2020-10-28 ENCOUNTER — Other Ambulatory Visit: Payer: Self-pay

## 2020-10-28 ENCOUNTER — Ambulatory Visit (INDEPENDENT_AMBULATORY_CARE_PROVIDER_SITE_OTHER): Payer: Managed Care, Other (non HMO) | Admitting: Cardiovascular Disease

## 2020-10-28 ENCOUNTER — Encounter: Payer: Self-pay | Admitting: Cardiovascular Disease

## 2020-10-28 VITALS — BP 138/78 | HR 65 | Ht 72.0 in | Wt 217.0 lb

## 2020-10-28 DIAGNOSIS — I2510 Atherosclerotic heart disease of native coronary artery without angina pectoris: Secondary | ICD-10-CM

## 2020-10-28 DIAGNOSIS — Z9861 Coronary angioplasty status: Secondary | ICD-10-CM

## 2020-10-28 DIAGNOSIS — I10 Essential (primary) hypertension: Secondary | ICD-10-CM | POA: Diagnosis not present

## 2020-10-28 MED ORDER — LOSARTAN POTASSIUM 100 MG PO TABS: 100.0000 mg | ORAL_TABLET | Freq: Every day | ORAL | 3 refills | Status: DC

## 2020-10-28 MED ORDER — RANOLAZINE ER 500 MG PO TB12: 500.0000 mg | ORAL_TABLET | Freq: Two times a day (BID) | ORAL | 11 refills | Status: DC

## 2020-10-28 NOTE — Patient Instructions (Addendum)
Medication Instructions:  1) Make sure you are only taking your Imdur 60mg  once daily in the morning 2) INCREASE Losartan to 100mg  once daily 3) START Ranexa 500mg  twice daily  *If you need a refill on your cardiac medications before your next appointment, please call your pharmacy*   Lab Work: None If you have labs (blood work) drawn today and your tests are completely normal, you will receive your results only by: MyChart Message (if you have MyChart) OR A paper copy in the mail If you have any lab test that is abnormal or we need to change your treatment, we will call you to review the results.   Testing/Procedures: None   Follow-Up: At [facility], you and your health needs are our priority.  As part of our continuing mission to provide you with exceptional heart care, we have created designated Provider Care Teams.  These Care Teams include your primary Cardiologist (physician) and Advanced Practice Providers (APPs -  Physician Assistants and Nurse Practitioners) who all work together to provide you with the care you need, when you need it.  We recommend signing up for the patient portal called "MyChart".  Sign up information is provided on this After Visit Summary.  MyChart is used to connect with patients for Virtual Visits (Telemedicine).  Patients are able to view lab/test results, encounter notes, upcoming appointments, etc.  Non-urgent messages can be sent to your provider as well.   To learn more about what you can do with MyChart, go to .    Your next appointment:   3 month(s)  The format for your next appointment:   In Person  Provider:   You will see one of the following Advanced Practice Providers on your designated Care Team:   , PA-C [facility], [email or web address]   Other Instructions   Occupational medicine Dr. Tereso Newcomer [facility]  [phone]

## 2020-10-29 ENCOUNTER — Other Ambulatory Visit: Payer: Self-pay | Admitting: Physician Assistant

## 2020-11-12 ENCOUNTER — Other Ambulatory Visit: Payer: Self-pay | Admitting: Physician Assistant

## 2020-12-17 ENCOUNTER — Other Ambulatory Visit: Payer: Self-pay | Admitting: Physician Assistant

## 2020-12-24 ENCOUNTER — Telehealth: Payer: Self-pay | Admitting: Cardiovascular Disease

## 2020-12-24 NOTE — Telephone Encounter (Signed)
Patient wanted to know if Dr. Elease Hashimoto received a restriction form from [employer]. Please call asap

## 2020-12-24 NOTE — Telephone Encounter (Signed)
Spoke with the patient who states that he is needing Dr. Elease Hashimoto to fill out a work restriction form. Patient reports that he is still having symptoms. He states that he still has a lot of fatigue and does get chest pressure intermittently. He states that it is usually with exertion but sometimes at rest. He states that the Ranexa has helped some. He still does not think he can return fully to work.  Form is in Dr. Harvie Bridge box. Advised patient that Dr. Elease Hashimoto will be back in the office on Monday and I will have him review.

## 2020-12-27 NOTE — Telephone Encounter (Signed)
Left a message for the pt to call back:   Re: his work restriction forms.   Per Dr. Elease Hashimoto if he is having Angina and worsening or persistent symptoms then he needs appt with APP and he has one coming up with Tereso Newcomer PA on 01/05/21.   Dr. Elease Hashimoto says he does not manage these types of forms but he can not only see cardiology re: his symptoms for reCath or he can call:   Dr. Holley Bouche Occupational medicine for potential managing of work restrictions and disability  His at [facility] pt lives in [place] Number [phone].

## 2020-12-28 NOTE — Telephone Encounter (Signed)
Called pt and notified him of Dr. Elease Hashimoto recommendation, to have Dr. Holley Bouche with [facility] (Occupational health) fill out his medical evaluation form.  Pt expresses that he has copies of this form so he does not need to copy that our office has.  He verbalizes understanding no questions or concerns.

## 2021-01-23 NOTE — Progress Notes (Deleted)
Office Visit    Patient Name: John [name] Date of Encounter: 01/23/2021  PCP:  Armando Gang, FNP   [facility]  Cardiologist:  Kristeen Miss, MD  Advanced Practice Provider:  No care team member to display Electrophysiologist:  None    Chief Complaint    John [name] is a 60 y.o. male with a hx of hypertension, hyperlipidemia, and CAD presents today for follow-up for his exertional chest pain.  Past Medical History    Past Medical History:  Diagnosis Date   Bell's palsy 07/31/2013   Displacement of lumbar intervertebral disc without myelopathy    High cholesterol    Hypertension    Past Surgical History:  Procedure Laterality Date   CORONARY STENT INTERVENTION N/A 11/03/2019   Procedure: CORONARY STENT INTERVENTION;  Surgeon: Corky Crafts, MD;  Location: [facility] INVASIVE CV LAB;  Service: Cardiovascular;  Laterality: N/A;   INTRAVASCULAR PRESSURE WIRE/FFR STUDY N/A 10/06/2020   Procedure: INTRAVASCULAR PRESSURE WIRE/FFR STUDY;  Surgeon: Marykay Lex, MD;  Location: [facility] INVASIVE CV LAB;  Service: Cardiovascular;  Laterality: N/A;   INTRAVASCULAR ULTRASOUND/IVUS N/A 11/03/2019   Procedure: Intravascular Ultrasound/IVUS;  Surgeon: Corky Crafts, MD;  Location: [facility] INVASIVE CV LAB;  Service: Cardiovascular;  Laterality: N/A;   LEFT HEART CATH AND CORONARY ANGIOGRAPHY N/A 11/03/2019   Procedure: LEFT HEART CATH AND CORONARY ANGIOGRAPHY;  Surgeon: Corky Crafts, MD;  Location: [facility] INVASIVE CV LAB;  Service: Cardiovascular;  Laterality: N/A;   LEFT HEART CATH AND CORONARY ANGIOGRAPHY N/A 10/06/2020   Procedure: LEFT HEART CATH AND CORONARY ANGIOGRAPHY;  Surgeon: Marykay Lex, MD;  Location: [facility] INVASIVE CV LAB;  Service: Cardiovascular;  Laterality: N/A;   WRIST SURGERY Right     Allergies  Allergies  Allergen Reactions   No [employer] Food Allergies     Shellfish-allergy testing Pecans-allergy testing [place]-allergy  testing cinnamon    History of Present Illness    John [name] is a 60 y.o. male with a hx of hypertension, hyperlipidemia, and CAD presents today for follow-up for his exertional chest pain.  He was last seen by Dr. Elease Hashimoto in July 2022.  He started having exertional chest pain while pushing his lawnmower in his yard in 2019.  He would have to stop and rest.  He later noticed chest pain while climbing stairs at the [facility].  His chest pain would last several minutes.  At this point he is having to limit his exercise due to his chest pain.  October 07, 2019:  John [name] is seen today for follow-up visit.  We saw him several months ago he was having episodes of chest discomfort.  We discussed heart catheterization and also discussed stress testing.  We schedule him for stress test but he never test done.  His sone passed away in 2022/09/05 and did not make it to the stress appt.   Still has some CP.  Occurs with heavy lifting and stress.  Pains last for a minute or so .  He has been having exertional chest pain for the past year or so.  They seem to be progressing.   January 13, 2020: John [name] is seen back today for follow-up of his coronary artery disease.  He status post stenting of his mid LAD.  There was a second diagonal stenosis that could not be wired because of the previously placed LAD stent.   CP is much better,  Has some doe on occasion .  Jan. 21, 2022: John [name] is seen today for work in visit.  He was recently at the dentist and was noted to have carotid calcifications on xray . 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  Follow-up as needed.    Heart catheterization November 03, 2019 shows that his mid LAD is occluded after the second  diagonal.  He has right to left collaterals.  He has a second diagonal vessel that has an 80% stenosis.  This could not be wired through the stent struts because of the acute angle.   The right coronary artery has a mid lesion of 50%.  In August 2022 he underwent cardiac catheterization which revealed a proximal RCA lesion of 60%, proximal to mid RCA lesion of 40%, and a mid RCA lesion of 50% stenosis.  He also had 5% stenosis of his mid to distal LAD stent, his first diagonal lesion was 70% stenosed and his estimated left ejection fraction was 55 to 65%.  His PCI was performed on 11/03/2019 by Dr. Eldridge Dace.   EKGs/Labs/Other Studies Reviewed:   The following studies were reviewed today:  Cardiac Cath 10/06/2020  Left Anterior Descending  Vessel is large.  Mid LAD to Dist LAD lesion is 5% stenosed. The lesion is located at the major branch. The lesion was previously treated using a drug eluting stent over 2 years ago.    First Diagonal Branch  Vessel is small in size.  1st Diag lesion is 70% stenosed. Vessel is jailed by the original stent 1 placed. Not successfully angioplastied at the time because wire went under stent strut. Appears to be stable from post PCI images.    Lateral First Diagonal Branch  Vessel is small in size.    Second Diagonal Branch  Vessel is small in size.    Third Diagonal Branch  Vessel is small in size.    Ramus Intermedius  Vessel is small.    Left Circumflex  Vessel is large. Vessel is angiographically normal.    First Obtuse Marginal Branch  Vessel is small in size.    Second Obtuse Marginal Branch  Vessel is large in size. Vessel is angiographically normal.    Right Coronary Artery  Vessel is small.  Prox RCA lesion is 60% stenosed. The lesion is discrete and concentric. RFR are negative -> 0.93 measuring into R VM, 0.97 in the main RCA  Prox RCA to Mid RCA lesion is 40% stenosed. The lesion is focal and eccentric.  Mid RCA lesion is 50%  stenosed. The lesion is focal and eccentric. Pressure wire/FFR was performed on the lesion. RFR 0.97    Acute Marginal Branch  Vessel is small in size.    Right Ventricular Branch  Vessel is small in size.    Right Posterior Descending Artery  Vessel is small in size.    Intervention   No interventions have been documented.   Wall Motion              Left Heart  Left Ventricle The left ventricular size is normal. The left ventricular systolic function is normal. The left ventricular ejection fraction is 55-65% by visual estimate. No regional wall motion abnormalities.  Aortic Valve There is no  aortic valve stenosis.   Coronary Diagrams   Diagnostic Dominance: Co-dominant   Stress Test: 07/12/2020  Nuclear stress EF: 61%. The left ventricular ejection fraction is normal (55-65%). There was no ST segment deviation noted during stress. No T wave inversion was noted during stress. Normal myocardial perfusion with no evidence of ischemia or infarction. The study is normal. This is a low risk study.   Laurance Flatten, MD   Scanned in study: Echocardiogram performed on 03/10/2019 which showed estimated LVEF of 54% and no significant valvular disease.  EKG:  EKG is *** ordered today.  The ekg ordered today demonstrates ***  Recent Labs: 10/01/2020: BUN 14; Creatinine, Ser 0.85; Hemoglobin 15.1; Platelets 245; Potassium 4.4; Sodium 146  Recent Lipid Panel    Component Value Date/Time   CHOL 114 01/13/2020 1013   TRIG 102 01/13/2020 1013   HDL 43 01/13/2020 1013   CHOLHDL 2.7 01/13/2020 1013   LDLCALC 52 01/13/2020 1013    Risk Assessment/Calculations:  {Does this patient have ATRIAL FIBRILLATION?:[id number]}  Home Medications   No outpatient medications have been marked as taking for the 01/24/21 encounter (Appointment) with Tereso Newcomer T, PA-C.     Review of Systems   ***   All other systems reviewed and are otherwise negative except as noted  above.  Physical Exam    VS:  There were no vitals taken for this visit. , BMI There is no height or weight on file to calculate BMI.  Wt Readings from Last 3 Encounters:  10/28/20 217 lb (98.4 kg)  10/06/20 215 lb (97.5 kg)  09/27/20 214 lb 3.2 oz (97.2 kg)     GEN: Well nourished, well developed, in no acute distress. HEENT: normal. Neck: Supple, no JVD, carotid bruits, or masses. Cardiac: ***RRR, no murmurs, rubs, or gallops. No clubbing, cyanosis, edema.  ***Radials/PT 2+ and equal bilaterally.  Respiratory:  ***Respirations regular and unlabored, clear to auscultation bilaterally. GI: Soft, nontender, nondistended. MS: No deformity or atrophy. Skin: Warm and dry, no rash. Neuro:  Strength and sensation are intact. Psych: Normal affect.  Assessment & Plan    Coronary artery diseases/p PCI 10/2019-cardiac catheterization showed stable 2-3 vessel disease. There was mild progression of the disease in the RCA.  Recommendation at this time is titration of medical management. Stress test in May 2022 revealed normal myocardial perfusion with no evidence of ischemia or infarction.  Estimated LVEF of 55 to 65%. No chest pain at this time. Will continue medical management ***. GDMT: asa, plavix, statin, coreg, cozaar, imdur, and repatha.   Hyperlipidemia- Continue Lipitor 80mg  daily and Repatha. No recent Lipid panel. Will order ***. Last LDL 52 on 11/21. Goal LDL < 70.   Carotid calcifications-right carotid artery with 1 to 39% stenosis.  Optimize statin.  Hypertension- Well controlled at home and on exam today. Continue medication regimen. Encourage low-sodium diet and continue taking BP at home at least once a day.   Disposition: Follow up {follow up:15908} with 12/21, MD or APP.  Signed, Kristeen Miss, PA-C 01/23/2021, 3:07 PM [facility]

## 2021-01-24 ENCOUNTER — Other Ambulatory Visit: Payer: Self-pay

## 2021-01-24 ENCOUNTER — Ambulatory Visit: Payer: Managed Care, Other (non HMO) | Admitting: Physician Assistant

## 2021-01-24 DIAGNOSIS — E785 Hyperlipidemia, unspecified: Secondary | ICD-10-CM

## 2021-01-24 DIAGNOSIS — I10 Essential (primary) hypertension: Secondary | ICD-10-CM

## 2021-01-24 DIAGNOSIS — I25118 Atherosclerotic heart disease of native coronary artery with other forms of angina pectoris: Secondary | ICD-10-CM

## 2021-01-24 DIAGNOSIS — I6521 Occlusion and stenosis of right carotid artery: Secondary | ICD-10-CM

## 2021-01-25 ENCOUNTER — Encounter: Payer: Self-pay | Admitting: *Deleted

## 2021-01-25 ENCOUNTER — Encounter: Payer: Self-pay | Admitting: Cardiovascular Disease

## 2021-01-25 ENCOUNTER — Ambulatory Visit (INDEPENDENT_AMBULATORY_CARE_PROVIDER_SITE_OTHER): Payer: Managed Care, Other (non HMO) | Admitting: Cardiovascular Disease

## 2021-01-25 VITALS — BP 132/78 | HR 67 | Ht 72.0 in | Wt 221.6 lb

## 2021-01-25 DIAGNOSIS — I10 Essential (primary) hypertension: Secondary | ICD-10-CM | POA: Diagnosis not present

## 2021-01-25 DIAGNOSIS — I2510 Atherosclerotic heart disease of native coronary artery without angina pectoris: Secondary | ICD-10-CM | POA: Diagnosis not present

## 2021-01-25 DIAGNOSIS — Z9861 Coronary angioplasty status: Secondary | ICD-10-CM | POA: Diagnosis not present

## 2021-01-25 MED ORDER — RANOLAZINE ER 1000 MG PO TB12: 1000.0000 mg | ORAL_TABLET | Freq: Two times a day (BID) | ORAL | 3 refills | Status: DC

## 2021-01-25 MED ORDER — CARVEDILOL 25 MG PO TABS: 25.0000 mg | ORAL_TABLET | Freq: Two times a day (BID) | ORAL | 3 refills | Status: DC

## 2021-01-25 NOTE — Patient Instructions (Addendum)
Medication Instructions:  Your physician has recommended you make the following change in your medication:   INCREASE the Ranexa to 1000 mg taking 1 twice a day  INCREASE the Carvedilol to 25 mg taking 1 twice a day  *If you need a refill on your cardiac medications before your next appointment, please call your pharmacy*   Lab Work: None ordered  If you have labs (blood work) drawn today and your tests are completely normal, you will receive your results only by: MyChart Message (if you have MyChart) OR A paper copy in the mail If you have any lab test that is abnormal or we need to change your treatment, we will call you to review the results.   Testing/Procedures: None ordered   Follow-Up: At [facility], you and your health needs are our priority.  As part of our continuing mission to provide you with exceptional heart care, we have created designated Provider Care Teams.  These Care Teams include your primary Cardiologist (physician) and Advanced Practice Providers (APPs -  Physician Assistants and Nurse Practitioners) who all work together to provide you with the care you need, when you need it.  We recommend signing up for the patient portal called "MyChart".  Sign up information is provided on this After Visit Summary.  MyChart is used to connect with patients for Virtual Visits (Telemedicine).  Patients are able to view lab/test results, encounter notes, upcoming appointments, etc.  Non-urgent messages can be sent to your provider as well.   To learn more about what you can do with MyChart, go to [email or web address].    Your next appointment:   04/25/2021 arrive at 7:45  The format for your next appointment:   In Person  Provider:   Kristeen Miss, MD     Other Instructions

## 2021-01-25 NOTE — Progress Notes (Signed)
Cardiology Office Note:    Date:  01/25/2021   ID:  John [name], DOB Dec 22, 1960, MRN [id number]  PCP:  Armando Gang, FNP  Cardiologist:  Sofie Schendel  Electrophysiologist:  None   Referring MD: Armando Gang, FNP   Chief Complaint  Patient presents with   Coronary Artery Disease         Previous notes:      John [name] is a 60 y.o. male with a hx of hypertension and hyperlipidemia.  He is seen today for further evaluation of some exertional chest discomfort.  We are asked to see him today by Dr. Welton Flakes  for further evaluation of this exertional chest pain.  Started having exertional CP while push mowing his yard in 2019. Would have to stop and rest. Later , he noticed CP while [place].  CP was achey,  Would last several minutes.  Would have to stop and relax .  Associated with sweats and dyspnea.   No dizziness  Is having to limit his exercise because of this cp .   Was in the hospital at [facility] with cp.   Does not recall whether or not he had a stress test.    October 07, 2019:  John [name] is seen today for follow-up visit.  We saw him several months ago he was having episodes of chest discomfort.  We discussed heart catheterization and also discussed stress testing.  We schedule him for stress test but he never test done.  His sone passed away in 2022/09/05 and did not make it to the stress appt.  Still has some CP.  Occurs with heavy lifting and stress.  Pains last for a minute or so .  He has been having exertional chest pain for the past year or so.  They seem to be progressing.  January 13, 2020: John [name] is seen back today for follow-up of his coronary artery disease.  He status post stenting of his mid LAD.  There was a second diagonal stenosis that could not be wired because of the previously placed LAD stent.  CP is much better,  Has some doe on occasion .   Jan. 21, 2022: John [name] is seen today for work in visit.  He was recently  at the dentist and was noted to have carotid calcifications on xray . 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  Follow-up as needed.   Heart catheterization November 03, 2019 shows that his mid LAD is occluded after the second diagonal.  He has right to left collaterals.  He has a second diagonal vessel that has an 80% stenosis.  This could not be wired through the stent struts because of the acute angle.  The right coronary artery has a mid lesion of 50%. He had stenting across the  LAD CTO by Dr. Eldridge Dace    Last LDL from Nov 2021 was 52  Was seen by Gillian Shields in September 05, 2022 Was started on Isosorbide Losartan was reduced to 50 mg a day   Still has chest pain  Cannot walk for long distances  Chest pressure , left chest pressure  Helped by stopping and taking NTG .   Unable to do his  job because of his cp and "mental foggyness" with exertion Is out of work at present  Was started on Pralulent by his primary - is not on our list   Will set him up for a cath, Increase coreg to 12.5 BID    Oct 28, 2020  Seen following cath. Personally reviewed Mid LAD stent is patent Branching diag has severe ostial disease just after branching off the LAD - would likely be a difficult PCI. Myoview in May showed no ischemia   January 25, 2021: John [name] is seen today for follow-up visit of his coronary artery disease. Is slightly better.  Still having some chest pain.   Is exertional  His exercise capacity is limited by his chest pain CP with climbing stairs.  Has a jailed Diag that is could be the cause of his chest pain  Myoview in May, 2022 showed no ischemia  Is taking NTG regularly - every 2-3 days  We will increase the carvedilol to 25 mg  twice a day and increase the Ranexa to 1000 mg twice a day starting today.   Past Medical History:  Diagnosis Date   Bell's palsy 07/31/2013   Displacement of lumbar intervertebral disc without myelopathy    High cholesterol    Hypertension     Past Surgical History:  Procedure Laterality Date   CORONARY STENT INTERVENTION N/A 11/03/2019   Procedure: CORONARY STENT INTERVENTION;  Surgeon: Corky Crafts, MD;  Location: [facility] INVASIVE CV LAB;  Service: Cardiovascular;  Laterality: N/A;   INTRAVASCULAR PRESSURE WIRE/FFR STUDY N/A 10/06/2020   Procedure: INTRAVASCULAR PRESSURE WIRE/FFR STUDY;  Surgeon: Marykay Lex, MD;  Location: [facility] INVASIVE CV LAB;  Service: Cardiovascular;  Laterality: N/A;   INTRAVASCULAR ULTRASOUND/IVUS N/A 11/03/2019   Procedure: Intravascular Ultrasound/IVUS;  Surgeon: Corky Crafts, MD;  Location: [facility] INVASIVE CV LAB;  Service: Cardiovascular;  Laterality: N/A;   LEFT HEART CATH AND CORONARY ANGIOGRAPHY N/A 11/03/2019   Procedure: LEFT HEART CATH AND CORONARY ANGIOGRAPHY;  Surgeon: Corky Crafts, MD;  Location: [facility] INVASIVE CV LAB;  Service: Cardiovascular;  Laterality: N/A;   LEFT HEART CATH AND CORONARY ANGIOGRAPHY N/A 10/06/2020   Procedure: LEFT HEART CATH AND CORONARY ANGIOGRAPHY;  Surgeon: Marykay Lex, MD;  Location: [facility] INVASIVE CV LAB;  Service: Cardiovascular;  Laterality: N/A;   WRIST SURGERY Right     Current Medications: Current Meds  Medication Sig   albuterol (VENTOLIN HFA) 108 (90 Base) MCG/ACT inhaler Inhale 1-2 puffs into the lungs every 6 (six) hours as needed for wheezing or shortness of breath.   aspirin EC 81 MG tablet Take 81 mg by mouth in the morning.   atorvastatin (LIPITOR) 80 MG tablet TAKE 1 TABLET BY MOUTH EVERY DAY   BLACK CURRANT SEED OIL PO Take 1,250 mg by mouth in the morning and at bedtime.   carvedilol (COREG) 25 MG tablet Take 1 tablet (25 mg total) by mouth 2 (two) times daily.   cetirizine (ZYRTEC) 10 MG  tablet Take 10 mg by mouth in the morning.   Cholecalciferol (VITAMIN D-3) 125 MCG (5000 UT) TABS Take 15,000 Units by mouth in the morning and at bedtime.   clopidogrel (PLAVIX) 75 MG tablet Take 1 tablet (75 mg total) by mouth daily.   Evolocumab (REPATHA) 140 MG/ML SOSY Inject 140 mg into the skin every Tuesday.   FLUoxetine (PROZAC) 10 MG capsule Take 10 mg by mouth once a week. At bedtime   gabapentin (NEURONTIN) 400 MG capsule Take 400  mg by mouth 2 (two) times daily as needed (pain).    ibuprofen (ADVIL) 800 MG tablet Take 800 mg by mouth 3 (three) times daily as needed (pain.).   isosorbide mononitrate (IMDUR) 60 MG 24 hr tablet Take 1 tablet (60 mg total) by mouth daily.   losartan (COZAAR) 100 MG tablet Take 1 tablet (100 mg total) by mouth daily.   nitroGLYCERIN (NITROSTAT) 0.3 MG SL tablet Place 1 tablet under the tongue every 5 (five) minutes x 3 doses as needed for chest pain.   OIL OF OREGANO PO Take 150 mg by mouth in the morning and at bedtime.   Probiotic Product (ACIDOPHILUS, PROBIOTIC, PO) Take 1 capsule by mouth every evening.   Pseudoephedrine-Naproxen Na (ALEVE-D SINUS & COLD) 120-220 MG TB12 Take 1 tablet by mouth daily as needed (sinus congestion.).   ranolazine (RANEXA) 1000 MG SR tablet Take 1 tablet (1,000 mg total) by mouth 2 (two) times daily.   TURMERIC PO Take 1 capsule by mouth in the morning and at bedtime. Turmeric Supreme   [DISCONTINUED] carvedilol (COREG) 12.5 MG tablet Take 1 tablet (12.5 mg total) by mouth 2 (two) times daily.   [DISCONTINUED] ranolazine (RANEXA) 500 MG 12 hr tablet Take 1 tablet (500 mg total) by mouth 2 (two) times daily.     Allergies:   No healthtouch food allergies   Social History   Socioeconomic History   Marital status: Married    Spouse name: Not on file   Number of children: Not on file   Years of education: Not on file   Highest education level: Not on file  Occupational History   Not on file  Tobacco Use   Smoking  status: Former   Smokeless tobacco: Never  Substance and Sexual Activity   Alcohol use: Yes    Comment: occasionally    Drug use: No   Sexual activity: Not on file  Other Topics Concern   Not on file  Social History Narrative   Not on file   Social Determinants of Health   Financial Resource Strain: Not on file  Food Insecurity: Not on file  Transportation Needs: Not on file  Physical Activity: Not on file  Stress: Not on file  Social Connections: Not on file     Family History: The patient's family history includes Diabetes in an other family member; Glaucoma in an other family member; High Cholesterol in an other family member; Hypertension in an other family member; Stroke in an other family member.  ROS:   Please see the history of present illness.     All other systems reviewed and are negative.  EKGs/Labs/Other Studies Reviewed:    The following studies were reviewed today:   Recent Labs: 10/01/2020: BUN 14; Creatinine, Ser 0.85; Hemoglobin 15.1; Platelets 245; Potassium 4.4; Sodium 146  Recent Lipid Panel    Component Value Date/Time   CHOL 114 01/13/2020 1013   TRIG 102 01/13/2020 1013   HDL 43 01/13/2020 1013   CHOLHDL 2.7 01/13/2020 1013   LDLCALC 52 01/13/2020 1013    Physical Exam:     Physical Exam: Blood pressure 132/78, pulse 67, height 6' (1.829 m), weight 221 lb 9.6 oz (100.5 kg), SpO2 98 %.  GEN:  Well nourished, well developed in no acute distress HEENT: Normal NECK: No JVD; No carotid bruits LYMPHATICS: No lymphadenopathy CARDIAC: RRR , no murmurs, rubs, gallops RESPIRATORY:  Clear to auscultation without rales, wheezing or rhonchi  ABDOMEN: Soft, non-tender, non-distended MUSCULOSKELETAL:  No edema; No deformity  SKIN: Warm and dry NEUROLOGIC:  Alert and oriented x 3      EKG:        ASSESSMENT:    No diagnosis found.     PLAN:      1.  CAD -    his main vessels are open.  He does have moderate disease in his right  coronary artery.  It is possible that his the RCA might be causing some ischemia and chest pain.  He also has several smaller to medium size diagonal vessels that are jailed from his stent.  These vessels will would be very difficult to approach from a percutaneous intervention standpoint.  He still has exertional anginal.  He is not able to climb stairs without having lots of chest pain.  Myoview study in May did not reveal any significant ischemia.  I am not sure that it would be beneficial to repeat it at this point. He has a jailed diagonal branch.  During his original angioplasty there was an attempt to wire this jailed diagonal but it was unsuccessful.  We will increase the carvedilol to 25 mg twice a day and increase the Ranexa to 1000 mg twice a day starting today.    2.  Hyperlipidemia:     He had labs in his primary medical doctor's office yesterday.  He will forward these results to [place].  His goal LDL is 50.  3.   Hypertension: Blood pressure is well controlled.       Medication Adjustments/Labs and Tests Ordered: Current medicines are reviewed at length with the patient today.  Concerns regarding medicines are outlined above.  No orders of the defined types were placed in this encounter.   Meds ordered this encounter  Medications   ranolazine (RANEXA) 1000 MG SR tablet    Sig: Take 1 tablet (1,000 mg total) by mouth 2 (two) times daily.    Dispense:  180 tablet    Refill:  3   carvedilol (COREG) 25 MG tablet    Sig: Take 1 tablet (25 mg total) by mouth 2 (two) times daily.    Dispense:  180 tablet    Refill:  3        Patient Instructions  Medication Instructions:  Your physician has recommended you make the following change in your medication:   INCREASE the Ranexa to 1000 mg taking 1 twice a day  INCREASE the Carvedilol to 25 mg taking 1 twice a day  *If you need a refill on your cardiac medications before your next appointment, please call your  pharmacy*   Lab Work: None ordered  If you have labs (blood work) drawn today and your tests are completely normal, you will receive your results only by: MyChart Message (if you have MyChart) OR A paper copy in the mail If you have any lab test that is abnormal or we need to change your treatment, we will call you to review the results.   Testing/Procedures: None ordered   Follow-Up: At [facility], you and your health needs are our priority.  As part of our continuing mission to provide you with exceptional heart care, we have created designated Provider Care Teams.  These Care Teams include your primary Cardiologist (physician) and Advanced Practice Providers (APPs -  Physician Assistants and Nurse Practitioners) who all work together to provide you with the care you need, when you need it.  We recommend signing up for the patient portal called "MyChart".  Sign up information is provided on this After Visit Summary.  MyChart is used to connect with patients for Virtual Visits (Telemedicine).  Patients are able to view lab/test results, encounter notes, upcoming appointments, etc.  Non-urgent messages can be sent to your provider as well.   To learn more about what you can do with MyChart, go to [email or web address].    Your next appointment:   04/25/2021 arrive at 7:45  The format for your next appointment:   In Person  Provider:   Kristeen Miss, MD     Other Instructions    Signed, Kristeen Miss, MD  01/25/2021 10:21 AM    [facility]

## 2021-04-24 NOTE — Progress Notes (Signed)
Cardiology Office Note:    Date:  04/25/2021   ID:  John [name], DOB Dec 25, 1960, MRN [id number]  PCP:  Remi Haggard, FNP  Cardiologist:  Bedelia Pong  Electrophysiologist:  None   Referring MD: Remi Haggard, FNP   Chief Complaint  Patient presents with   Coronary Artery Disease         Previous notes:      NAKEEM [name] is a 61 y.o. male with a hx of hypertension and hyperlipidemia.  He is seen today for further evaluation of some exertional chest discomfort.  We are asked to see him today by Dr. Humphrey Rolls  for further evaluation of this exertional chest pain.  Started having exertional CP while push mowing his yard in 2019. Would have to stop and rest. Later , he noticed CP [place].  CP was achey,  Would last several minutes.  Would have to stop and relax .  Associated with sweats and dyspnea.   No dizziness  Is having to limit his exercise because of this cp .   Was in the hospital at [facility] with cp.   Does not recall whether or not he had a stress test.    October 07, 2019:  Mr. John [name] is seen today for follow-up visit.  We saw him several months ago he was having episodes of chest discomfort.  We discussed heart catheterization and also discussed stress testing.  We schedule him for stress test but he never test done.  His sone passed away in 2022/09/02 and did not make it to the stress appt.  Still has some CP.  Occurs with heavy lifting and stress.  Pains last for a minute or so .  He has been having exertional chest pain for the past year or so.  They seem to be progressing.  January 13, 2020: John [name] is seen back today for follow-up of his coronary artery disease.  He status post stenting of his mid LAD.  There was a second diagonal stenosis that could not be wired because of the previously placed LAD stent.  CP is much better,  Has some doe on occasion .   Jan. 21, 2022: John [name] is seen today for work in visit.  He was recently  at the dentist and was noted to have carotid calcifications on xray . 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  Follow-up as needed.   Heart catheterization November 03, 2019 shows that his mid LAD is occluded after the second diagonal.  He has right to left collaterals.  He has a second diagonal vessel that has an 80% stenosis.   The LAD was stented.   This could not be wired through the stent struts because of the acute angle.  The right coronary artery has a mid lesion of 50%. He had stenting across the  LAD CTO by Dr. Irish Lack    Last LDL from Nov 2021 was 52  Was seen by Laurann Montana in 09-02-2022 Was started on Isosorbide Losartan was reduced to 50 mg a day   Still has chest pain  Cannot walk for long distances  Chest pressure , left chest pressure  Helped by stopping and taking NTG .  Unable to do his job because of his cp and "mental foggyness" with exertion Is out of work at present  Was started on Pralulent by his primary - is not on our list   Will set him up for a cath, Increase coreg to 12.5 BID    Oct 28, 2020  Seen following cath. Personally reviewed Mid LAD stent is patent Branching diag has severe ostial disease just after branching off the LAD - would likely be a difficult PCI. Myoview in May showed no ischemia   January 25, 2021: John [name] is seen today for follow-up visit of his coronary artery disease. Is slightly better.  Still having some chest pain.   Is exertional  His exercise capacity is limited by his chest pain CP with climbing stairs.  Has a jailed Diag that is could be the cause of his chest pain  Myoview in May, 2022 showed no ischemia  Is taking NTG regularly - every 2-3 days  We will increase the  carvedilol to 25 mg twice a day and increase the Ranexa to 1000 mg twice a day starting today.   Feb. 20, 2023 John [name] is seen for follow up of his CAD,  has a mid LAD stent Has a jailed D1 at the stent location Still having some chest pain with exercise.   Is not able to exercise due to the chest pain  Relieved with SL NTG   Past Medical History:  Diagnosis Date   Bell's palsy 07/31/2013   Displacement of lumbar intervertebral disc without myelopathy    High cholesterol    Hypertension     Past Surgical History:  Procedure Laterality Date   CORONARY STENT INTERVENTION N/A 11/03/2019   Procedure: CORONARY STENT INTERVENTION;  Surgeon: Jettie Booze, MD;  Location: [place] CV LAB;  Service: Cardiovascular;  Laterality: N/A;   INTRAVASCULAR PRESSURE WIRE/FFR STUDY N/A 10/06/2020   Procedure: INTRAVASCULAR PRESSURE WIRE/FFR STUDY;  Surgeon: Leonie Man, MD;  Location: [place] CV LAB;  Service: Cardiovascular;  Laterality: N/A;   INTRAVASCULAR ULTRASOUND/IVUS N/A 11/03/2019   Procedure: Intravascular Ultrasound/IVUS;  Surgeon: Jettie Booze, MD;  Location: [place] CV LAB;  Service: Cardiovascular;  Laterality: N/A;   LEFT HEART CATH AND CORONARY ANGIOGRAPHY N/A 11/03/2019   Procedure: LEFT HEART CATH AND CORONARY ANGIOGRAPHY;  Surgeon: Jettie Booze, MD;  Location: [place] CV LAB;  Service: Cardiovascular;  Laterality: N/A;   LEFT HEART CATH AND CORONARY ANGIOGRAPHY N/A 10/06/2020   Procedure: LEFT HEART CATH AND CORONARY ANGIOGRAPHY;  Surgeon: Leonie Man, MD;  Location: [place] CV LAB;  Service: Cardiovascular;  Laterality: N/A;   WRIST SURGERY Right     Current Medications: Current Meds  Medication Sig   aspirin EC 81 MG tablet Take 81 mg by mouth in the morning.   atorvastatin (LIPITOR) 80 MG tablet TAKE 1 TABLET BY MOUTH EVERY DAY   BLACK CURRANT SEED OIL PO Take 1,250 mg by mouth in the morning and at bedtime.   carvedilol (COREG) 25  MG tablet Take 1 tablet (25 mg total) by mouth 2 (two) times daily.   cetirizine (ZYRTEC) 10 MG tablet Take 10 mg by mouth in the morning.   Cholecalciferol (VITAMIN D-3) 125 MCG (5000 UT) TABS Take 15,000 Units by mouth in the morning and at bedtime.   clopidogrel (PLAVIX) 75 MG tablet Take 1 tablet (75 mg total) by mouth daily.   gabapentin (NEURONTIN) 400 MG capsule Take 400 mg by mouth 2 (  two) times daily as needed (pain).    ibuprofen (ADVIL) 800 MG tablet Take 800 mg by mouth 3 (three) times daily as needed (pain.).   isosorbide mononitrate (IMDUR) 60 MG 24 hr tablet Take 1 tablet (60 mg total) by mouth daily.   losartan (COZAAR) 100 MG tablet Take 1 tablet (100 mg total) by mouth daily.   nitroGLYCERIN (NITROSTAT) 0.3 MG SL tablet Place 1 tablet under the tongue every 5 (five) minutes x 3 doses as needed for chest pain.   OIL OF OREGANO PO Take 150 mg by mouth in the morning and at bedtime.   Probiotic Product (ACIDOPHILUS, PROBIOTIC, PO) Take 1 capsule by mouth every evening.   ranolazine (RANEXA) 1000 MG SR tablet Take 1 tablet (1,000 mg total) by mouth 2 (two) times daily.   TURMERIC PO Take 1 capsule by mouth in the morning and at bedtime. Turmeric Supreme     Allergies:   No healthtouch food allergies   Social History   Socioeconomic History   Marital status: Married    Spouse name: Not on file   Number of children: Not on file   Years of education: Not on file   Highest education level: Not on file  Occupational History   Not on file  Tobacco Use   Smoking status: Former   Smokeless tobacco: Never  Substance and Sexual Activity   Alcohol use: Yes    Comment: occasionally    Drug use: No   Sexual activity: Not on file  Other Topics Concern   Not on file  Social History Narrative   Not on file   Social Determinants of Health   Financial Resource Strain: Not on file  Food Insecurity: Not on file  Transportation Needs: Not on file  Physical Activity: Not on file   Stress: Not on file  Social Connections: Not on file     Family History: The patient's family history includes Diabetes in an other family member; Glaucoma in an other family member; High Cholesterol in an other family member; Hypertension in an other family member; Stroke in an other family member.  ROS:   Please see the history of present illness.     All other systems reviewed and are negative.  EKGs/Labs/Other Studies Reviewed:    The following studies were reviewed today:   Recent Labs: 10/01/2020: BUN 14; Creatinine, Ser 0.85; Hemoglobin 15.1; Platelets 245; Potassium 4.4; Sodium 146  Recent Lipid Panel    Component Value Date/Time   CHOL 114 01/13/2020 1013   TRIG 102 01/13/2020 1013   HDL 43 01/13/2020 1013   CHOLHDL 2.7 01/13/2020 1013   LDLCALC 52 01/13/2020 1013    Physical Exam:    Physical Exam: Blood pressure 138/78, pulse 64, height 6' (1.829 m), weight 220 lb 6.4 oz (100 kg), SpO2 98 %.  GEN:  Well nourished, well developed in no acute distress HEENT: Normal NECK: No JVD; No carotid bruits LYMPHATICS: No lymphadenopathy CARDIAC: RRR , no murmurs, rubs, gallops RESPIRATORY:  Clear to auscultation without rales, wheezing or rhonchi  ABDOMEN: Soft, non-tender, non-distended MUSCULOSKELETAL:  No edema; No deformity  SKIN: Warm and dry NEUROLOGIC:  Alert and oriented x 3    EKG:        ASSESSMENT:    No diagnosis found.     PLAN:      1.  CAD -   Adoniah has known CAD and has a jailed D1 from a previously placed mid LAD stent.  He  continues to have angina and cannot exercise at all.  Will get a ETT myoview to assess his ischemic burden.  He would like to see Dr. Fletcher Anon for follow up.  I am in agreement with having him see Dr. Fletcher Anon    2.  Hyperlipidemia:     LDL is 52.   3.   Hypertension:  BP is well controlled.        Medication Adjustments/Labs and Tests Ordered: Current medicines are reviewed at length with the patient today.   Concerns regarding medicines are outlined above.  No orders of the defined types were placed in this encounter.   No orders of the defined types were placed in this encounter.    There are no Patient Instructions on file for this visit.   Signed, Mertie Moores, MD  04/25/2021 8:59 AM    [place]

## 2021-04-25 ENCOUNTER — Telehealth: Payer: Self-pay | Admitting: Cardiovascular Disease

## 2021-04-25 ENCOUNTER — Ambulatory Visit (INDEPENDENT_AMBULATORY_CARE_PROVIDER_SITE_OTHER): Payer: Managed Care, Other (non HMO) | Admitting: Cardiovascular Disease

## 2021-04-25 ENCOUNTER — Other Ambulatory Visit: Payer: Self-pay

## 2021-04-25 ENCOUNTER — Encounter: Payer: Self-pay | Admitting: *Deleted

## 2021-04-25 ENCOUNTER — Telehealth (HOSPITAL_COMMUNITY): Payer: Self-pay | Admitting: *Deleted

## 2021-04-25 ENCOUNTER — Encounter: Payer: Self-pay | Admitting: Cardiovascular Disease

## 2021-04-25 VITALS — BP 138/78 | HR 64 | Ht 72.0 in | Wt 220.4 lb

## 2021-04-25 DIAGNOSIS — I25118 Atherosclerotic heart disease of native coronary artery with other forms of angina pectoris: Secondary | ICD-10-CM

## 2021-04-25 DIAGNOSIS — R072 Precordial pain: Secondary | ICD-10-CM | POA: Diagnosis not present

## 2021-04-25 NOTE — Telephone Encounter (Signed)
Patient given detailed instructions per Myocardial Perfusion Study Information Sheet for the test on 05/02/21 at 7:45. Patient notified to arrive 15 minutes early and that it is imperative to arrive on time for appointment to keep from having the test rescheduled.  If you need to cancel or reschedule your appointment, please call the office within 24 hours of your appointment. . Patient verbalized understanding.John [name]

## 2021-04-25 NOTE — Telephone Encounter (Signed)
New Message:   Amy from [employer] called. She said patient saw Dr Elease Hashimoto today. She wants to know if Dr Elease Hashimoto would complete his Medical  Evaluation Form ? She have faxed the form earlier, do not know if he have it.

## 2021-04-25 NOTE — Telephone Encounter (Signed)
New message    Patient request to switch to Dr Fletcher Anon and start seeing him at the [place] office.  He is currently seeing Dr Acie Fredrickson but Dr Acie Fredrickson is "cutting" back and pt request Dr Fletcher Anon.  Is this ok to switch to Dr Fletcher Anon?  Patient is having nuclear stress test next week and will need a follow up appointment in 2-3 months.

## 2021-04-25 NOTE — Patient Instructions (Signed)
Medication Instructions:  The current medical regimen is effective;  continue present plan and medications.  *If you need a refill on your cardiac medications before your next appointment, please call your pharmacy*  Testing/Procedures: Your physician has requested that you have a myoview stress test.   Follow-Up: At [facility], you and your health needs are our priority.  As part of our continuing mission to provide you with exceptional heart care, we have created designated Provider Care Teams.  These Care Teams include your primary Cardiologist (physician) and Advanced Practice Providers (APPs -  Physician Assistants and Nurse Practitioners) who all work together to provide you with the care you need, when you need it.  We recommend signing up for the patient portal called "MyChart".  Sign up information is provided on this After Visit Summary.  MyChart is used to connect with patients for Virtual Visits (Telemedicine).  Patients are able to view lab/test results, encounter notes, upcoming appointments, etc.  Non-urgent messages can be sent to your provider as well.   To learn more about what you can do with MyChart, go to [email or web address].    Your next appointment:   2 - 3 month(s)  The format for your next appointment:   In Person  Provider:   Dr Kirke Corin (pt preference)  Thank you for choosing [facility]!!

## 2021-04-26 NOTE — Telephone Encounter (Signed)
Thanks

## 2021-05-02 ENCOUNTER — Other Ambulatory Visit: Payer: Self-pay

## 2021-05-02 ENCOUNTER — Ambulatory Visit (HOSPITAL_COMMUNITY): Payer: Managed Care, Other (non HMO) | Attending: Cardiology

## 2021-05-02 DIAGNOSIS — R072 Precordial pain: Secondary | ICD-10-CM | POA: Diagnosis not present

## 2021-05-02 LAB — MYOCARDIAL PERFUSION IMAGING
Angina Index: 2
Base ST Depression (mm): 0 mm
Duke Treadmill Score: -1
Estimated workload: 8.5
Exercise duration (min): 7 min
Exercise duration (sec): 0 s
LV dias vol: 84 mL (ref 62–150)
LV sys vol: 32 mL
MPHR: 158 {beats}/min
Nuc Stress EF: 63 %
Peak HR: 129 {beats}/min
Percent HR: 81 %
Rest HR: 64 {beats}/min
Rest Nuclear Isotope Dose: 9.1 mCi
SDS: 0
SRS: 0
SSS: 0
ST Depression (mm): 0 mm
Stress Nuclear Isotope Dose: 30.1 mCi
TID: 1.01

## 2021-05-02 MED ORDER — TECHNETIUM TC 99M TETROFOSMIN IV KIT
30.1000 | PACK | Freq: Once | INTRAVENOUS | Status: AC | PRN
Start: 2021-05-02 — End: 2021-05-02
  Administered 2021-05-02: 30.1 via INTRAVENOUS
  Filled 2021-05-02: qty 31

## 2021-05-02 MED ORDER — TECHNETIUM TC 99M TETROFOSMIN IV KIT
9.1000 | PACK | Freq: Once | INTRAVENOUS | Status: AC | PRN
  Administered 2021-05-02: 9.1 via INTRAVENOUS
  Filled 2021-05-02: qty 10

## 2021-05-05 ENCOUNTER — Telehealth: Payer: Self-pay | Admitting: Cardiovascular Disease

## 2021-05-05 MED ORDER — RANOLAZINE ER 1000 MG PO TB12: 1000.0000 mg | ORAL_TABLET | Freq: Two times a day (BID) | ORAL | 3 refills | Status: DC

## 2021-05-05 NOTE — Telephone Encounter (Signed)
?*  STAT* If patient is at the pharmacy, call can be transferred to refill team. ? ? ?1. Which medications need to be refilled? (please list name of each medication and dose if known)  ?ranolazine (RANEXA) 1000 MG SR tablet ? ?2. Which pharmacy/location (including street and city if local pharmacy) is medication to be sent to?[id number] - [place], [place] - [address] ? ?3. Do they need a 30 day or 90 day supply? 90 ds ? ?

## 2021-05-06 ENCOUNTER — Telehealth: Payer: Self-pay | Admitting: Cardiovascular Disease

## 2021-05-06 NOTE — Telephone Encounter (Signed)
Pt c/o medication issue: ? ?1. Name of Medication: ranolazine (RANEXA) 1000 MG SR tablet ? ?2. How are you currently taking this medication (dosage and times per day)? As directed ? ?3. Are you having a reaction (difficulty breathing--STAT)? no ? ?4. What is your medication issue? Cost ? ?Patient's insurance does not cover much of the insurance. He still has to pay over $400 out of pocket to get the medication. His wife wanted to know if there was an alternative medication he could take that would be more affordable ?

## 2021-05-06 NOTE — Telephone Encounter (Signed)
Will forward to be Nahser for review. ? ?

## 2021-05-09 NOTE — Telephone Encounter (Signed)
Spoke with the patient's wife and advised on recommendations from Dr. Elease Hashimoto. She is going to speak with her husband and let us know if he decides to stop taking Ranexa.  ?

## 2021-05-19 ENCOUNTER — Telehealth: Payer: Self-pay | Admitting: Cardiovascular Disease

## 2021-05-19 NOTE — Telephone Encounter (Signed)
Pt c/o medication issue: ? ?1. Name of Medication: Ranolazine- he stopped about 2 weeks ago- he said it was too expensive ? ?2. How are you currently taking this medication (dosage and times per day)?  ? ?3. Are you having a reaction (difficulty breathing--STAT)? been having chest pains every day since he stopped taking it and been taking Nitroglycerin every day ? ?4. What is your medication issue? Having chest pains after he stopped this medicine ?

## 2021-05-19 NOTE — Telephone Encounter (Signed)
I agree with looking into patient assistance program  ? Outside of that I dont have any suggestions.     ?  ?He is going to see Dr. Kirke Corin in 2 months .  ?  ?  ?PN  ? ? ?Called patient's wife back with Dr. Harvie Bridge message. Patient's wife verbalized understanding. ?

## 2021-05-19 NOTE — Telephone Encounter (Signed)
Called patient's wife (DPR). Patient complaining of chest pain since he stopped his ranexa and feeling dizzy. Patient has been taking nitro every day to help. Patient is already taking imdur daily. Will forward to Dr. Acie Fredrickson for advisement. Will send message to Pre-auth nurse to see if she can help with Ranexa cost. ?

## 2021-06-16 ENCOUNTER — Other Ambulatory Visit: Payer: Self-pay | Admitting: Cardiovascular Disease

## 2021-06-25 IMAGING — DX DG CHEST 2V
2 series · 2 of 2 positions shown · non-contrast
Comparison: 07/28/2012

CLINICAL DATA: Chest pain beginning 3 days ago.

EXAM:
CHEST - 2 VIEW

[w chest pa]
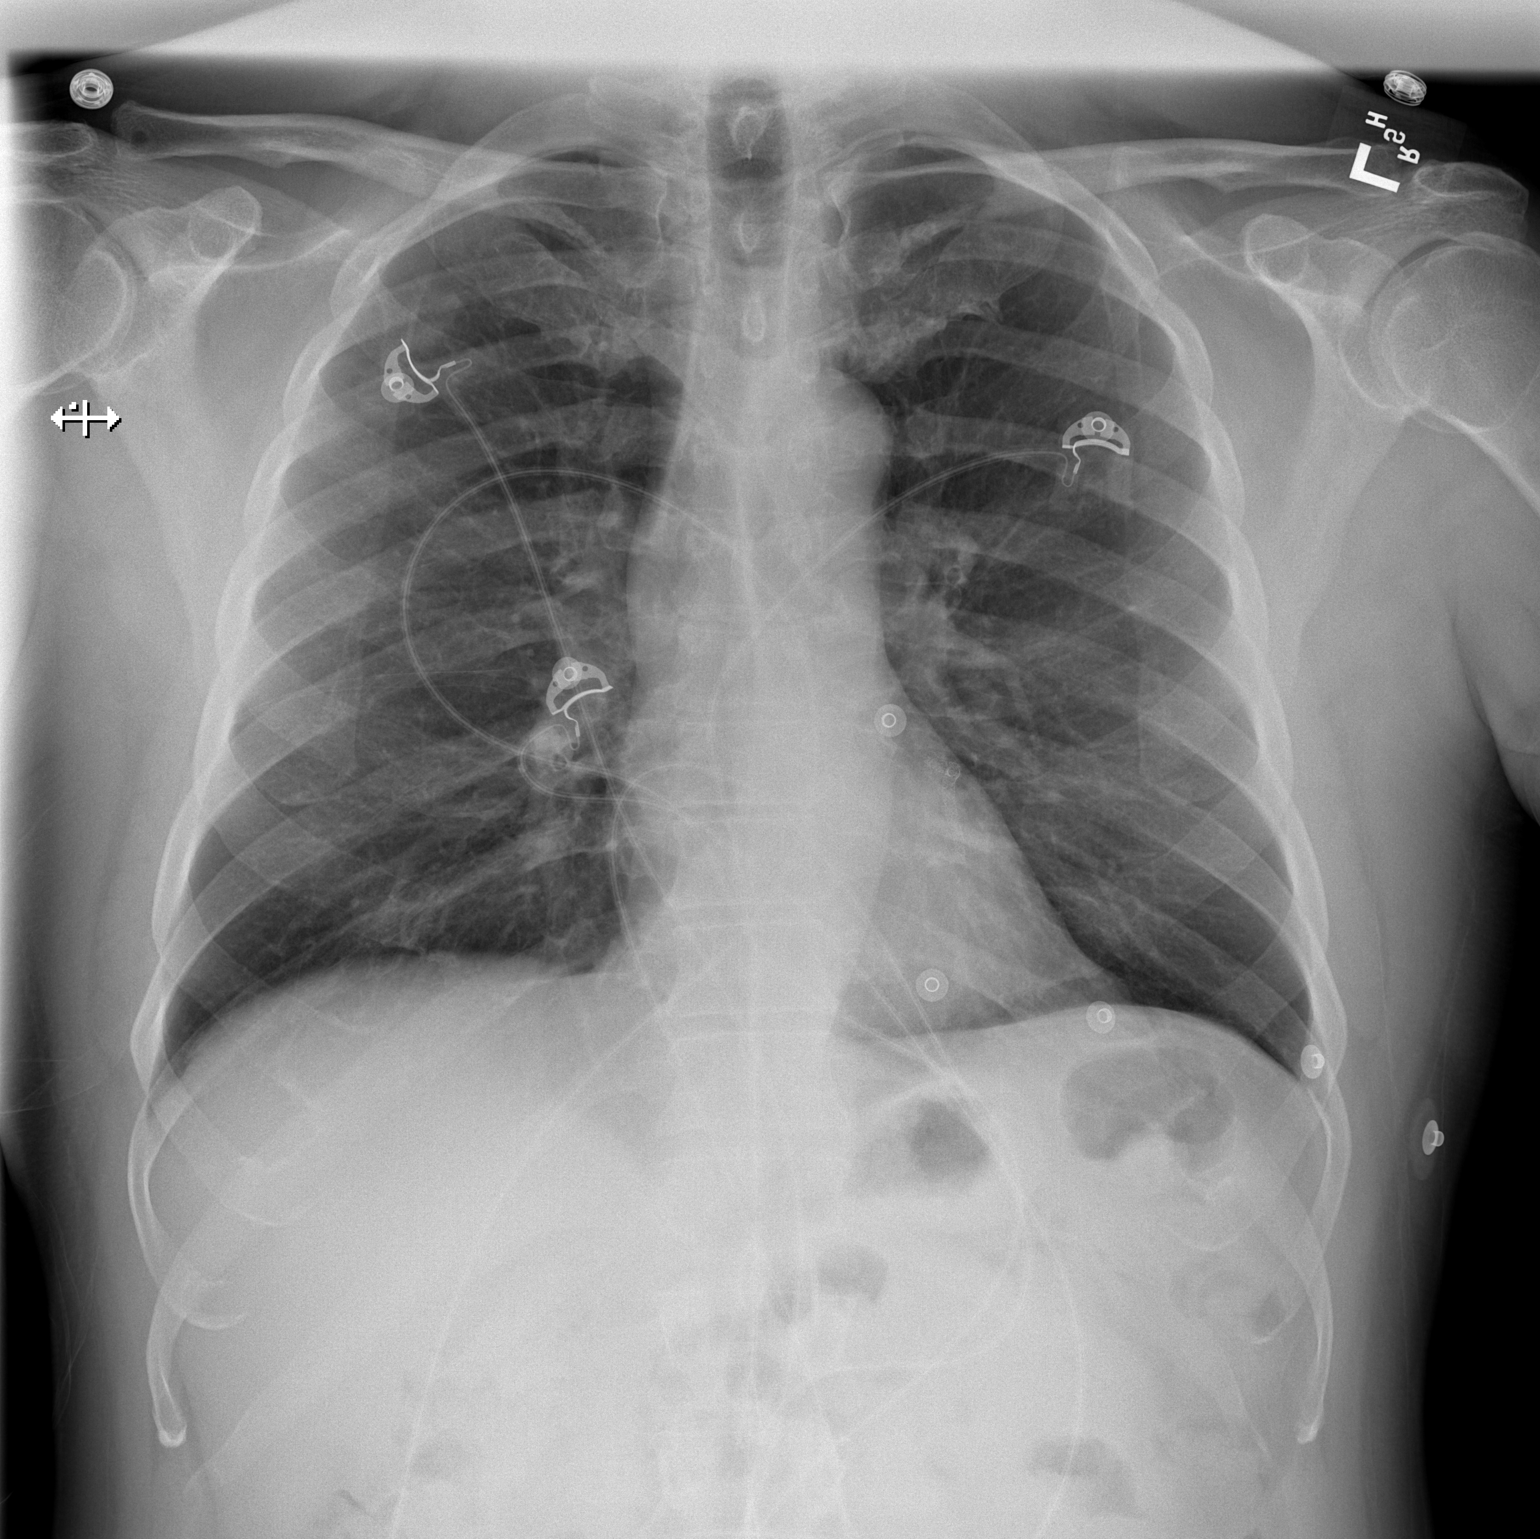

[w chest lat]
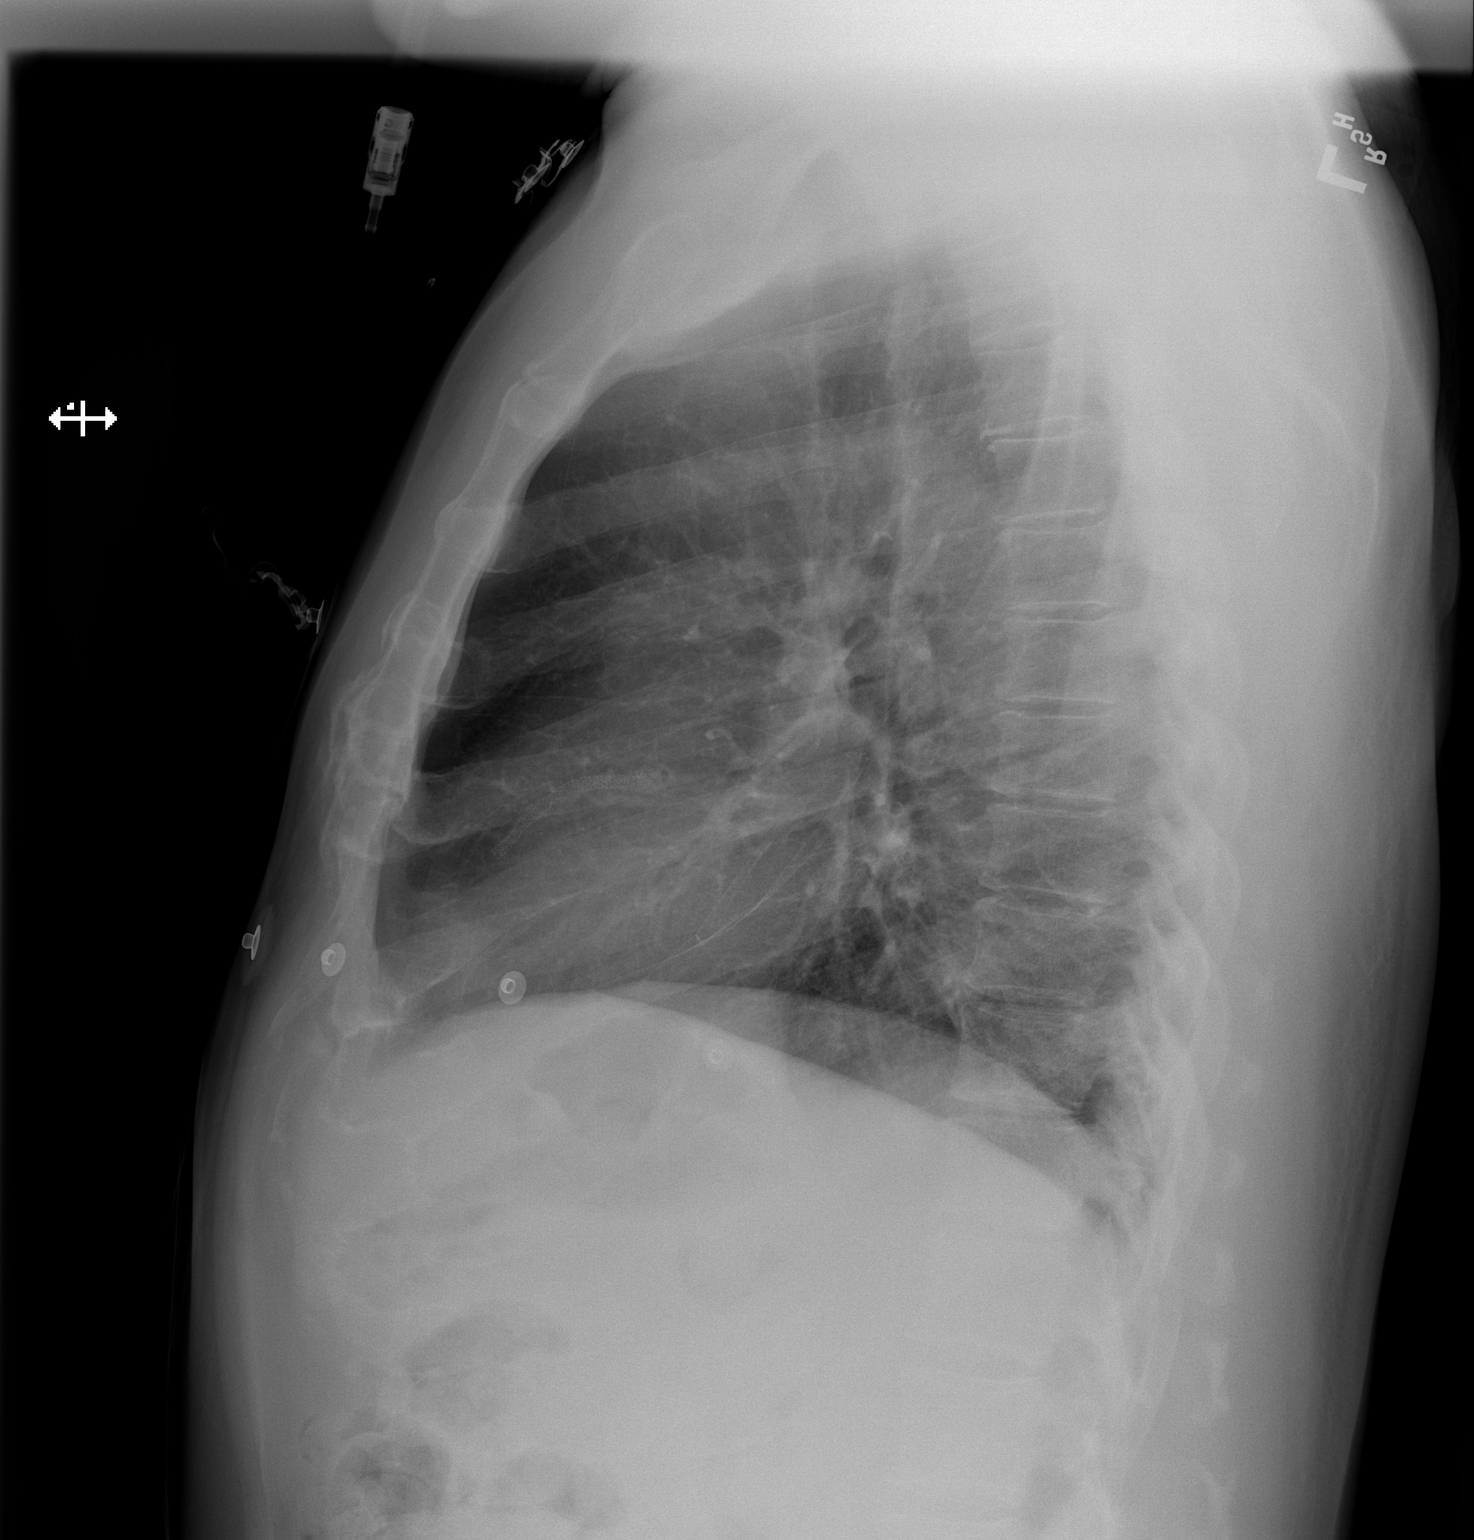

[2 of 2 positions shown; findings below may reference images not displayed]

FINDINGS: Heart size is normal. Coronary artery stent evident in the left
system. Mediastinal shadows are otherwise normal. The lungs are
clear. The vascularity is normal. No effusions. No significant bone
finding.
IMPRESSION: No active disease. Coronary artery stent.

## 2021-07-11 ENCOUNTER — Ambulatory Visit (INDEPENDENT_AMBULATORY_CARE_PROVIDER_SITE_OTHER): Payer: Managed Care, Other (non HMO) | Admitting: Podiatry

## 2021-07-11 ENCOUNTER — Ambulatory Visit (INDEPENDENT_AMBULATORY_CARE_PROVIDER_SITE_OTHER): Payer: Managed Care, Other (non HMO)

## 2021-07-11 DIAGNOSIS — M7751 Other enthesopathy of right foot: Secondary | ICD-10-CM

## 2021-07-11 DIAGNOSIS — M7752 Other enthesopathy of left foot: Secondary | ICD-10-CM

## 2021-07-11 DIAGNOSIS — M722 Plantar fascial fibromatosis: Secondary | ICD-10-CM

## 2021-07-11 DIAGNOSIS — M7750 Other enthesopathy of unspecified foot: Secondary | ICD-10-CM

## 2021-07-11 MED ORDER — CICLOPIROX 8 % EX SOLN: Freq: Every day | CUTANEOUS | 0 refills | Status: DC

## 2021-07-14 ENCOUNTER — Encounter: Payer: Self-pay | Admitting: Cardiovascular Disease

## 2021-07-14 ENCOUNTER — Ambulatory Visit (INDEPENDENT_AMBULATORY_CARE_PROVIDER_SITE_OTHER): Payer: Managed Care, Other (non HMO) | Admitting: Cardiovascular Disease

## 2021-07-14 VITALS — BP 110/70 | HR 61 | Ht 72.0 in | Wt 218.1 lb

## 2021-07-14 DIAGNOSIS — I25118 Atherosclerotic heart disease of native coronary artery with other forms of angina pectoris: Secondary | ICD-10-CM

## 2021-07-14 DIAGNOSIS — I10 Essential (primary) hypertension: Secondary | ICD-10-CM

## 2021-07-14 DIAGNOSIS — E785 Hyperlipidemia, unspecified: Secondary | ICD-10-CM

## 2021-07-14 NOTE — Patient Instructions (Signed)
Medication Instructions:  ?Your physician has recommended you make the following change in your medication:  ? ?STOP Repatha ? ?Your have been given a patient assistance application for Praluent today. Please complete the application and attach the required documentation. You can mail the application and documents or return them to our office and we will fax them over for you. ? ?*If you need a refill on your cardiac medications before your next appointment, please call your pharmacy* ? ? ?Lab Work: ?None ordered  ? ?If you have labs (blood work) drawn today and your tests are completely normal, you will receive your results only by: ?MyChart Message (if you have MyChart) OR ?A paper copy in the mail ?If you have any lab test that is abnormal or we need to change your treatment, we will call you to review the results. ? ? ?Testing/Procedures: ?None ordered ? ? ?Follow-Up: ?At [facility], you and your health needs are our priority.  As part of our continuing mission to provide you with exceptional heart care, we have created designated Provider Care Teams.  These Care Teams include your primary Cardiologist (physician) and Advanced Practice Providers (APPs -  Physician Assistants and Nurse Practitioners) who all work together to provide you with the care you need, when you need it. ? ?We recommend signing up for the patient portal called "MyChart".  Sign up information is provided on this After Visit Summary.  MyChart is used to connect with patients for Virtual Visits (Telemedicine).  Patients are able to view lab/test results, encounter notes, upcoming appointments, etc.  Non-urgent messages can be sent to your provider as well.   ?To learn more about what you can do with MyChart, go to [email or web address].   ? ?Your next appointment:   ?6 month(s) ? ?The format for your next appointment:   ?In Person ? ?Provider:   ?You may see Lorine Bears, MD or one of the following Advanced Practice Providers on your  designated Care Team:   ?Nicolasa Ducking, NP ?Eula Listen, PA-C ?Cadence Fransico Michael, PA-C ? ?Other Instructions ?N/A ? ?Important Information About Sugar ? ? ? ? ? ? ?

## 2021-07-14 NOTE — Progress Notes (Signed)
?  ?Cardiology Office Note ? ? ?Date:  07/14/2021  ? ?ID:  John CoeGerrard F Burmaster, DOB [birth year], MRN [id number] ? ?PCP:  Armando GangLindley, Cheryl P, FNP  ?Cardiologist:   Lorine BearsMuhammad Glenora Morocho, MD  ? ?Chief Complaint  ?Patient presents with  ? OTHER  ?  2-3 Month f/u former Dr. Elease HashimotoNahser pt c/o chest pain and sob. Pt is taking Repatha inj. Pt is normally given samples. Pt needs assistance for Repatha or Praluent. Meds reviewed verbally with pt.  ? ? ?  ?History of Present Illness: ?John [name] is a 61 y.o. male who presents to establish cardiovascular care with me regarding coronary artery disease and stable angina..  He has known history of essential hypertension and hyperlipidemia.  He had previous carotid calcifications on x-rays.  Carotid Doppler in February 2022 showed minimal plaque on the right side.  No obstructive disease. ?He had exertional chest pain in 2021 and underwent cardiac catheterization in August 2021 which showed occluded mid LAD, 80% ostial second diagonal stenosis and moderate RCA disease.  Ejection fraction was normal.  He underwent successful angioplasty and long drug-eluting stent placement to the LAD.  Second diagonal was jailed by the stent but no change in ostial stenosis or flow. ?He was hospitalized in April 2022 with chest pain and negative troponin.  [facility] which showed no evidence of ischemia with normal ejection fraction. ?He continues to have exertional or nonexertional chest pain in spite of antianginal medications and thus he underwent left heart catheterization in August 2022.  Catheterization showed widely patent mid LAD stent with jailed second diagonal with stable 80% ostial stenosis, 60% proximal/mid RCA disease not significant by fractional flow reserve with an RFR ratio of 0.97.  Ejection fraction and LVEDP were both normal.  Medical therapy was recommended. ? ?He subsequently underwent a treadmill nuclear stress test in February 2023.  He had no evidence of  ischemia by both EKG or perfusion imaging.  The patient exercised for 7 minutes and achieved a maximum heart rate of 81%.  He had chest discomfort during the test. ? ?He reports stable symptoms overall.  He does complain of exertional chest pain but he has not had to use sublingual nitroglycerin lately.  He gets occasional episodes of pain at rest as well. ? ? ?Past Medical History:  ?Diagnosis Date  ? Bell's palsy 07/31/2013  ? Displacement of lumbar intervertebral disc without myelopathy   ? High cholesterol   ? Hypertension   ? ? ?Past Surgical History:  ?Procedure Laterality Date  ? CORONARY STENT INTERVENTION N/A 11/03/2019  ? Procedure: CORONARY STENT INTERVENTION;  Surgeon: Corky CraftsVaranasi, Jayadeep S, MD;  [facility];  Service: Cardiovascular;  Laterality: N/A;  ? INTRAVASCULAR PRESSURE WIRE/FFR STUDY N/A 10/06/2020  ? Procedure: INTRAVASCULAR PRESSURE WIRE/FFR STUDY;  Surgeon: Marykay LexHarding, David W, MD;  [facility];  Service: Cardiovascular;  Laterality: N/A;  ? INTRAVASCULAR ULTRASOUND/IVUS N/A 11/03/2019  ? Procedure: Intravascular Ultrasound/IVUS;  Surgeon: Corky CraftsVaranasi, Jayadeep S, MD;  [facility];  Service: Cardiovascular;  Laterality: N/A;  ? LEFT HEART CATH AND CORONARY ANGIOGRAPHY N/A 11/03/2019  ? Procedure: LEFT HEART CATH AND CORONARY ANGIOGRAPHY;  Surgeon: Corky CraftsVaranasi, Jayadeep S, MD;  [facility];  Service: Cardiovascular;  Laterality: N/A;  ? LEFT HEART CATH AND CORONARY ANGIOGRAPHY N/A 10/06/2020  ? Procedure: LEFT HEART CATH AND CORONARY ANGIOGRAPHY;  Surgeon: Marykay LexHarding, David W, MD;  [facility];  Service: Cardiovascular;  Laterality: N/A;  ? WRIST SURGERY Right   ? ? ? ?Current Outpatient Medications  ?Medication Sig Dispense Refill  ? albuterol (VENTOLIN HFA) 108 (90 Base) MCG/ACT inhaler Inhale 1-2 puffs into the lungs every 6 (six) hours as needed for wheezing or shortness of breath.    ? aspirin EC 81 MG tablet Take 81 mg by  mouth in the morning.    ? atorvastatin (LIPITOR) 80 MG tablet TAKE 1 TABLET BY MOUTH EVERY DAY 90 tablet 3  ? carvedilol (COREG) 25 MG tablet Take 1 tablet (25 mg total) by mouth 2 (two) times daily. 180 tablet 3  ? cetirizine (ZYRTEC) 10 MG tablet Take 10 mg by mouth in the morning.    ? Cholecalciferol (VITAMIN D-3) 125 MCG (5000 UT) TABS Take 15,000 Units by mouth in the morning and at bedtime.    ? ciclopirox (PENLAC) 8 % solution Apply topically at bedtime. Apply over nail and surrounding skin. Apply daily over previous coat. After seven (7) days, may remove with alcohol and continue cycle. 6.6 mL 0  ? clopidogrel (PLAVIX) 75 MG tablet TAKE 1 TABLET BY MOUTH EVERY DAY 90 tablet 3  ? Evolocumab (REPATHA) 140 MG/ML SOSY Inject 140 mg into the skin every Tuesday.    ? gabapentin (NEURONTIN) 400 MG capsule Take 400 mg by mouth 2 (two) times daily as needed (pain).     ? ibuprofen (ADVIL) 800 MG tablet Take 800 mg by mouth 3 (three) times daily as needed (pain.).    ? isosorbide mononitrate (IMDUR) 60 MG 24 hr tablet Take 1 tablet (60 mg total) by mouth daily. 90 tablet 3  ? losartan (COZAAR) 100 MG tablet Take 1 tablet (100 mg total) by mouth daily. 90 tablet 3  ? nitroGLYCERIN (NITROSTAT) 0.3 MG SL tablet Place 1 tablet under the tongue every 5 (five) minutes x 3 doses as needed for chest pain.    ? Probiotic Product (ACIDOPHILUS, PROBIOTIC, PO) Take 1 capsule by mouth every evening.    ? ranolazine (RANEXA) 1000 MG SR tablet Take 1 tablet (1,000 mg total) by mouth 2 (two) times daily. 180 tablet 3  ? PRALUENT 150 MG/ML SOAJ Inject into the skin. (Patient not taking: Reported on 07/14/2021)    ? ?No current facility-administered medications for this visit.  ? ? ?Allergies:   No healthtouch food allergies  ? ? ?Social History:  The patient  reports that he has quit smoking. He has never used smokeless tobacco. He reports current alcohol use. He reports that he does not use drugs.  ? ?Family History:  The patient's  family history includes Diabetes in an other family member; Glaucoma in an other family member; High Cholesterol in an other family member; Hypertension in an other family member; Stroke in an other family member.  ? ? ?ROS:  Please see the history of present illness.   Otherwise, review of systems are positive for none.   All other systems are reviewed and negative.  ? ? ?PHYSICAL EXAM: ?VS:  BP 110/70 (BP Location: Left Arm, Patient Position: Sitting, Cuff Size: Normal)   Pulse 61   Ht 6' (1.829 m)   Wt 218 lb 2 oz (98.9 kg)   SpO2 98%   BMI 29.58 kg/m?  , BMI Body mass index is 29.58 kg/m?. ?GEN: Well nourished, well developed, in no acute distress  ?HEENT: normal  ?Neck: no JVD, carotid bruits, or masses ?Cardiac: RRR; no murmurs, rubs, or gallops,no edema  ?Respiratory:  clear  to auscultation bilaterally, normal work of breathing ?GI: soft, nontender, nondistended, + BS ?MS: no deformity or atrophy  ?Skin: warm and dry, no rash ?Neuro:  Strength and sensation are intact ?Psych: euthymic mood, full affect ? ? ?EKG:  EKG is ordered today. ?The ekg ordered today demonstrates normal sinus rhythm with no significant ST or T wave changes. ? ? ?Recent Labs: ?10/01/2020: BUN 14; Creatinine, Ser 0.85; Hemoglobin 15.1; Platelets 245; Potassium 4.4; Sodium 146  ? ? ?Lipid Panel ?   ?Component Value Date/Time  ? CHOL 114 01/13/2020 1013  ? TRIG 102 01/13/2020 1013  ? HDL 43 01/13/2020 1013  ? CHOLHDL 2.7 01/13/2020 1013  ? LDLCALC 52 01/13/2020 1013  ? ?  ? ?Wt Readings from Last 3 Encounters:  ?07/14/21 218 lb 2 oz (98.9 kg)  ?05/02/21 220 lb (99.8 kg)  ?04/25/21 220 lb 6.4 oz (100 kg)  ?  ? ? ? ?   ? View : No data to display.  ?  ?  ?  ? ?I spent at least 30 minutes reviewing his prior cardiac studies and cardiac imaging including catheterizations and stress testing. ? ? ?ASSESSMENT AND PLAN: ? ?1.  Coronary artery disease involving native coronary arteries with stable angina: I had a prolonged discussion with the  patient and I also reviewed with him all his prior imaging including cardiac catheterizations and nuclear stress testing.  It is possible that some of his angina is coming from the ostial second diagonal ste

## 2021-07-17 ENCOUNTER — Encounter: Payer: Self-pay | Admitting: Podiatry

## 2021-07-17 NOTE — Progress Notes (Signed)
?  Subjective:  ?Patient ID: John [name], male    DOB: September 29, 1960,  MRN: [id number] ? ?Chief Complaint  ?Patient presents with  ? Foot Problem  ?  Knot in ball of left foot- about a year  ?Heel pain that started about a week. Pt works standing on concrete.   ? ? ?61 y.o. male presents with the above complaint. History confirmed with patient.  Also has discoloration in the toenails ? ?Objective:  ?Physical Exam: ?warm, good capillary refill, no trophic changes or ulcerative lesions, normal DP and PT pulses, and normal sensory exam.  Onychomycosis noted ?Left Foot: point tenderness over the heel pad and small fibroma mid plantar fascia ? ? ?Radiographs: ?Multiple views x-ray of the left foot: no fracture, dislocation, swelling or degenerative changes noted and plantar calcaneal spur ?Assessment:  ? ?1. Plantar fasciitis of left foot   ? ? ? ?Plan:  ?Patient was evaluated and treated and all questions answered. ? ?Discussed the etiology and treatment options for plantar fasciitis including stretching, formal physical therapy, supportive shoegears such as a running shoe or sneaker, pre fabricated orthoses, injection therapy, and oral medications. We also discussed the role of surgical treatment of this for patients who do not improve after exhausting non-surgical treatment options. ? ? ?-XR reviewed with patient ?-Educated patient on stretching and icing of the affected limb ?-Plantar fascial brace dispensed ?-Injection delivered to the plantar fascia of the left foot. ? ?After sterile prep with povidone-iodine solution and alcohol, the left heel was injected with 0.5cc 2% xylocaine plain, 0.5cc 0.5% marcaine plain, 5mg  triamcinolone acetonide, and 2mg  dexamethasone was injected along the medial plantar fascia at the insertion on the plantar calcaneus. The patient tolerated the procedure well without complication. ? ?Return in about 1 month (around 08/11/2021) for recheck plantar fasciitis.  ? ?

## 2021-07-20 ENCOUNTER — Telehealth: Payer: Self-pay | Admitting: Podiatry

## 2021-07-20 NOTE — Telephone Encounter (Signed)
Mailed exercises( AVS) to pt. ?

## 2021-07-20 NOTE — Telephone Encounter (Signed)
Pts wife called and stated pt was to be given exercises to help and he did not get them on his avs. Could you add it to the avs and I told them I would mail it to them since they live in [place]. ? ? ?

## 2021-07-20 NOTE — Patient Instructions (Signed)

## 2021-08-08 ENCOUNTER — Ambulatory Visit (INDEPENDENT_AMBULATORY_CARE_PROVIDER_SITE_OTHER): Payer: Managed Care, Other (non HMO) | Admitting: Podiatry

## 2021-08-08 ENCOUNTER — Encounter: Payer: Self-pay | Admitting: Podiatry

## 2021-08-08 DIAGNOSIS — M722 Plantar fascial fibromatosis: Secondary | ICD-10-CM | POA: Diagnosis not present

## 2021-08-08 NOTE — Progress Notes (Signed)
  Subjective:  Patient ID: John [name], male    DOB: [birth year],  MRN: [id number]  Chief Complaint  Patient presents with   Plantar Fasciitis    Left foot, 1 month follow up    61 y.o. male presents with the above complaint. History confirmed with patient.    Interval history: Says he is doing somewhat better feels about 60% better from a pain standpoint.  He brought his work boots with him today.  Feels like the nails are improving Objective:  Physical Exam: warm, good capillary refill, no trophic changes or ulcerative lesions, normal DP and PT pulses, and normal sensory exam.  Onychomycosis noted Left Foot: Minimal tenderness to palpation directly in the plantar heel or medial calcaneal tubercle   Radiographs: Multiple views x-ray of the left foot: no fracture, dislocation, swelling or degenerative changes noted and plantar calcaneal spur Assessment:   1. Plantar fasciitis of left foot      Plan:  Patient was evaluated and treated and all questions answered.  Discussed the etiology and treatment options for plantar fasciitis including stretching, formal physical therapy, supportive shoegears such as a running shoe or sneaker, pre fabricated orthoses, injection therapy, and oral medications. We also discussed the role of surgical treatment of this for patients who do not improve after exhausting non-surgical treatment options.    So far is doing much better.  I recommend he continue using the plantar fascial brace.  We discussed the option of another prednisone taper right now if needed.  We also discussed the option of another injection but with his improvement I expect this to resolve without this and he would prefer to avoid this.  I inspected his work shoes on a have good cushioning but little arch support which we discussed, he said previously they have had arch supports made at work through a vendor and he will see if he can get the schedule he was out of work the last  time this happened.  If its not possible to do this and he will call for fitting for orthotics here in our office.  I will see him back as needed if it does not improve in 4 to 6 weeks.  Return if symptoms worsen or fail to improve.

## 2021-10-02 ENCOUNTER — Other Ambulatory Visit: Payer: Self-pay | Admitting: Cardiovascular Disease

## 2021-10-21 ENCOUNTER — Other Ambulatory Visit: Payer: Self-pay | Admitting: Podiatry

## 2021-10-22 ENCOUNTER — Other Ambulatory Visit: Payer: Self-pay | Admitting: Cardiovascular Disease

## 2021-11-12 ENCOUNTER — Other Ambulatory Visit: Payer: Self-pay | Admitting: Cardiovascular Disease

## 2022-01-10 ENCOUNTER — Other Ambulatory Visit: Payer: Self-pay

## 2022-01-10 ENCOUNTER — Encounter (HOSPITAL_COMMUNITY): Payer: Self-pay | Admitting: Emergency Medicine

## 2022-01-10 ENCOUNTER — Emergency Department (HOSPITAL_COMMUNITY)
Admission: EM | Admit: 2022-01-10 | Discharge: 2022-01-10 | Disposition: A | Discharge status: Home / Self Care | Payer: Managed Care, Other (non HMO) | Attending: Emergency Medicine | Admitting: Emergency Medicine

## 2022-01-10 ENCOUNTER — Emergency Department (HOSPITAL_COMMUNITY): Payer: Managed Care, Other (non HMO)

## 2022-01-10 DIAGNOSIS — M792 Neuralgia and neuritis, unspecified: Secondary | ICD-10-CM

## 2022-01-10 DIAGNOSIS — R079 Chest pain, unspecified: Secondary | ICD-10-CM

## 2022-01-10 DIAGNOSIS — I10 Essential (primary) hypertension: Secondary | ICD-10-CM | POA: Diagnosis not present

## 2022-01-10 DIAGNOSIS — I2510 Atherosclerotic heart disease of native coronary artery without angina pectoris: Secondary | ICD-10-CM | POA: Insufficient documentation

## 2022-01-10 DIAGNOSIS — Z7982 Long term (current) use of aspirin: Secondary | ICD-10-CM | POA: Diagnosis not present

## 2022-01-10 DIAGNOSIS — M25512 Pain in left shoulder: Secondary | ICD-10-CM

## 2022-01-10 DIAGNOSIS — Z79899 Other long term (current) drug therapy: Secondary | ICD-10-CM | POA: Diagnosis not present

## 2022-01-10 DIAGNOSIS — M79602 Pain in left arm: Secondary | ICD-10-CM | POA: Diagnosis not present

## 2022-01-10 LAB — CBC
HCT: 44.1 % (ref 39.0–52.0)
Hemoglobin: 15.2 g/dL (ref 13.0–17.0)
MCH: 32.7 pg (ref 26.0–34.0)
MCHC: 34.5 g/dL (ref 30.0–36.0)
MCV: 94.8 fL (ref 80.0–100.0)
Platelets: 214 10*3/uL (ref 150–400)
RBC: 4.65 MIL/uL (ref 4.22–5.81)
RDW: 12.8 % (ref 11.5–15.5)
WBC: 4.2 10*3/uL (ref 4.0–10.5)
nRBC: 0 % (ref 0.0–0.2)

## 2022-01-10 LAB — BASIC METABOLIC PANEL
Anion gap: 11 (ref 5–15)
BUN: 10 mg/dL (ref 8–23)
CO2: 27 mmol/L (ref 22–32)
Calcium: 9.6 mg/dL (ref 8.9–10.3)
Chloride: 105 mmol/L (ref 98–111)
Creatinine, Ser: 0.99 mg/dL (ref 0.61–1.24)
GFR, Estimated: >60 mL/min (ref >60)
Glucose, Bld: 102 mg/dL — ABNORMAL HIGH (ref 70–99)
Potassium: 4.2 mmol/L (ref 3.5–5.1)
Sodium: 143 mmol/L (ref 135–145)

## 2022-01-10 LAB — TROPONIN I (HIGH SENSITIVITY)
Troponin I (High Sensitivity): 3 ng/L (ref <18)
Troponin I (High Sensitivity): 2 ng/L (ref <18)

## 2022-01-10 MED ORDER — CYCLOBENZAPRINE HCL 10 MG PO TABS
10.0000 mg | ORAL_TABLET | Freq: Once | ORAL | Status: AC
  Administered 2022-01-10: 10 mg via ORAL
  Filled 2022-01-10: qty 1

## 2022-01-10 MED ORDER — OXYCODONE HCL 5 MG PO TABS: 5.0000 mg | ORAL_TABLET | ORAL | 0 refills | Status: DC | PRN

## 2022-01-10 MED ORDER — KETOROLAC TROMETHAMINE 30 MG/ML IJ SOLN
30.0000 mg | Freq: Once | INTRAMUSCULAR | Status: AC
  Administered 2022-01-10: 30 mg via INTRAVENOUS

## 2022-01-10 MED ORDER — CYCLOBENZAPRINE HCL 10 MG PO TABS: 10.0000 mg | ORAL_TABLET | Freq: Two times a day (BID) | ORAL | 0 refills | Status: DC | PRN

## 2022-01-10 MED ORDER — METHYLPREDNISOLONE 4 MG PO TBPK: ORAL_TABLET | ORAL | 0 refills | Status: DC

## 2022-01-10 MED ORDER — KETOROLAC TROMETHAMINE 30 MG/ML IJ SOLN
30.0000 mg | Freq: Once | INTRAMUSCULAR | Status: DC
  Filled 2022-01-10: qty 1

## 2022-01-10 NOTE — ED Triage Notes (Signed)
Pt. Stated, I've had chest pain with SOB for the last mont and it gets worse and worse, it starts in my shoulder. Also my tongue at the tip has been numb.

## 2022-01-10 NOTE — ED Notes (Signed)
Patient verbalizes understanding of discharge instructions. Opportunity for questioning and answers were provided. Armband removed by staff, pt discharged from ED. Pt ambulatory to ED waiting room with steady gait.  

## 2022-01-10 NOTE — ED Provider Notes (Signed)
[facility] Provider Note   CSN: [id number] Arrival date & time: 01/10/22  1016     History {Add pertinent medical, surgical, social history, OB history to HPI:1} Chief Complaint  Patient presents with   Chest Pain   Shortness of Breath    John [name] is a 61 y.o. male.  HPI     61 year old male with history of coronary artery disease, hypertension, hyperlipidemia   Had angioplasty and DES in August 2021  Runs up arm to shoulder and runs back down left arm to below elbow Sharp, achy pain, feels like numbness, tip of tongue numb 2 days numbness, shoulder pain was severe Thursday, Friday CP and shoulder pain for the past month At work make PPF, slab it off a big roll, carry it to the trash, lifting that for a while.  Not sure if that is why. Can pick up anything with left arm, walking around with hand behind head, is now holding out from body which is more comfortable. Worse with movement, lifting, sharp pain 10+ pain  Has been constant for the last month and progressing Numbness to left hand on ulnar side, back of arm Fatigue , dizzy if standing too fast from squatting Wife had rotator cuff problems, feels like similar  Having neck pain, if move arm certain way or grab something even a book will hurt Feels a pulling in chest too Sometimes dyspnea when climbing up a hill, remembers it happening in 2019 but did not improve in 2021 after stent Falls out of bed from nightmares sometimes No nausea, vomiting, abdominal pain, leg pain or swelling  Taking medications from Cardiology  Taking gabapentin, 400mg  BID is rx, now sometimes taking 3 times a day, will help him sleep    Past Medical History:  Diagnosis Date   Bell's palsy 07/31/2013   Coronary artery disease    Displacement of lumbar intervertebral disc without myelopathy    High cholesterol    Hypertension      Home Medications Prior to Admission medications    Medication Sig Start Date End Date Taking? Authorizing Provider  albuterol (VENTOLIN HFA) 108 (90 Base) MCG/ACT inhaler Inhale 1-2 puffs into the lungs every 6 (six) hours as needed for wheezing or shortness of breath.    [provider]  aspirin EC 81 MG tablet Take 81 mg by mouth in the morning.    [provider]  atorvastatin (LIPITOR) 80 MG tablet TAKE 1 TABLET BY MOUTH EVERY DAY 11/14/21   Nahser, Wonda Cheng, MD  carvedilol (COREG) 25 MG tablet Take 1 tablet (25 mg total) by mouth 2 (two) times daily. 01/25/21 07/14/21  Nahser, Wonda Cheng, MD  cetirizine (ZYRTEC) 10 MG tablet Take 10 mg by mouth in the morning.    [provider]  Cholecalciferol (VITAMIN D-3) 125 MCG (5000 UT) TABS Take 15,000 Units by mouth in the morning and at bedtime.    [provider]  ciclopirox (PENLAC) 8 % solution APPLY TOPICALLY AT BEDTIME. APPLY OVER NAIL AND SURROUNDING SKIN. APPLY DAILY OVER PREVIOUS COAT. AFTER SEVEN (7) DAYS, MAY REMOVE WITH ALCOHOL AND CONTINUE CYCLE. 10/25/21   Criselda Peaches, DPM  clopidogrel (PLAVIX) 75 MG tablet TAKE 1 TABLET BY MOUTH EVERY DAY 06/16/21   Nahser, Wonda Cheng, MD  gabapentin (NEURONTIN) 400 MG capsule Take 400 mg by mouth 2 (two) times daily as needed (pain).     [provider]  ibuprofen (ADVIL) 800 MG tablet Take  800 mg by mouth 3 (three) times daily as needed (pain.). 02/13/19   [provider]  isosorbide mononitrate (IMDUR) 60 MG 24 hr tablet TAKE 1 TABLET BY MOUTH EVERY DAY 10/03/21   Nahser, Deloris Ping, MD  losartan (COZAAR) 100 MG tablet TAKE 1 TABLET BY MOUTH EVERY DAY 10/24/21   Nahser, Deloris Ping, MD  nitroGLYCERIN (NITROSTAT) 0.3 MG SL tablet Place 1 tablet under the tongue every 5 (five) minutes x 3 doses as needed for chest pain. 02/25/19   [provider]  PRALUENT 150 MG/ML SOAJ Inject into the skin. Patient not taking: Reported on 07/14/2021 06/28/21   [provider]  Probiotic Product (ACIDOPHILUS,  PROBIOTIC, PO) Take 1 capsule by mouth every evening.    [provider]  ranolazine (RANEXA) 1000 MG SR tablet Take 1 tablet (1,000 mg total) by mouth 2 (two) times daily. 05/05/21   Nahser, Deloris Ping, MD      Allergies    No healthtouch food allergies    Review of Systems   Review of Systems  Physical Exam Updated Vital Signs BP 113/76 (BP Location: Right Arm)   Pulse 63   Temp 97.9 F (36.6 C)   Resp 18   Ht 6' (1.829 m)   Wt 99.8 kg   SpO2 97%   BMI 29.84 kg/m  Physical Exam  ED Results / Procedures / Treatments   Labs (all labs ordered are listed, but only abnormal results are displayed) Labs Reviewed  BASIC METABOLIC PANEL - Abnormal; Notable for the following components:      Result Value   Glucose, Bld 102 (*)    All other components within normal limits  CBC  TROPONIN I (HIGH SENSITIVITY)  TROPONIN I (HIGH SENSITIVITY)    EKG EKG Interpretation  Date/Time:  Tuesday January 10 2022 10:33:19 EST Ventricular Rate:  66 PR Interval:  144 QRS Duration: 76 QT Interval:  374 QTC Calculation: 392 R Axis:   4 Text Interpretation: Normal sinus rhythm Normal ECG When compared with ECG of 06-Oct-2020 10:03, PREVIOUS ECG IS PRESENT Confirmed by Alona Bene [phone]) on 01/10/2022 11:02:11 AM  Radiology DG Chest 2 View  Result Date: 01/10/2022 CLINICAL DATA:  Chest pain. EXAM: CHEST - 2 VIEW COMPARISON:  June 30, 2020. FINDINGS: The heart size and mediastinal contours are within normal limits. Both lungs are clear. The visualized skeletal structures are unremarkable. IMPRESSION: No active cardiopulmonary disease. Electronically Signed   By: Lupita Raider M.D.   On: 01/10/2022 11:19    Procedures Procedures  {Document cardiac monitor, telemetry assessment procedure when appropriate:1}  Medications Ordered in ED Medications - No data to display  ED Course/ Medical Decision Making/ A&P                           Medical Decision Making  ***  {Document  critical care time when appropriate:1} {Document review of labs and clinical decision tools ie heart score, Chads2Vasc2 etc:1}  {Document your independent review of radiology images, and any outside records:1} {Document your discussion with family members, caretakers, and with consultants:1} {Document social determinants of health affecting pt's care:1} {Document your decision making why or why not admission, treatments were needed:1} Final Clinical Impression(s) / ED Diagnoses Final diagnoses:  None    Rx / DC Orders ED Discharge Orders     None

## 2022-01-10 NOTE — ED Provider Triage Note (Signed)
Emergency Medicine Provider Triage Evaluation Note  John [name] , a 61 y.o. male  was evaluated in triage.  Pt complains of history of cardiac stent approximately 2 years ago who presents to the ED for evaluation of 1 month of substernal chest pain with radiation to the left arm.  Chest pain is described as sharp and stabbing.  Left arm pain is described as numbness and tingling.  Also reports intermittent shortness of breath over the past month as well.  Denies fevers, chills, nausea, vomiting, diaphoresis.  Review of Systems  Positive: As above Negative: As above  Physical Exam  BP (!) 146/85 (BP Location: Right Arm)   Pulse 69   Temp 97.9 F (36.6 C) (Oral)   Resp 16   SpO2 97%  Gen:   Awake, no distress  Resp:  Normal effort lungs CTA B MSK:   Moves extremities without difficulty  Other:    Medical Decision Making  Medically screening exam initiated at 10:55 AM.  Appropriate orders placed.  John [name] was informed that the remainder of the evaluation will be completed by another provider, this initial triage assessment does not replace that evaluation, and the importance of remaining in the ED until their evaluation is complete.  ACS work-up initiated   John [name] 01/10/22 1056

## 2022-01-11 ENCOUNTER — Telehealth: Payer: Self-pay | Admitting: Cardiovascular Disease

## 2022-01-11 NOTE — Telephone Encounter (Signed)
Returned call to patient who states since being discharged from hospital (negtive CXR, negative troponins, EKG shows NSR) and starting on flexeril, oxycodone, medrol. Still endorses SOB with exertion, feeling dizzy upon standing after bending over or squatting and low BP. Last BP in ED was 117/67

## 2022-01-11 NOTE — Telephone Encounter (Signed)
STAT if patient feels like he/she is going to faint   Are you dizzy now?  No, only when patient squats down and then stands back up   Do you feel faint or have you passed out?  No   Do you have any other symptoms?  Chest pain--went to the ED yesterday  Have you checked your HR and BP (record if available)?  BP lower than normal, per patient--systolic is around 117   Pt c/o of Chest Pain: STAT if CP now or developed within 24 hours  1. Are you having CP right now?  Not right now, only occurs when he moves his arm in a particular way   2. Are you experiencing any other symptoms (ex. SOB, nausea, vomiting, sweating)?    3. How long have you been experiencing CP?  About 2 months, becoming more frequent  4. Is your CP continuous or coming and going?  Coming and going with minimal exertion  5. Have you taken Nitroglycerin?  Yes  ?

## 2022-01-11 NOTE — Telephone Encounter (Signed)
Patient states the pain is unchanged and felt in his arm, shoulder, and into chest. Offered first available appt for Friday 11/10 w/ Dr Elease Hashimoto and he agrees to be seen. Informed him that we likely need to do a stress test to rule out ischemia and potentially an ECHO. He agrees to plan.

## 2022-01-13 ENCOUNTER — Encounter: Payer: Self-pay | Admitting: Cardiovascular Disease

## 2022-01-13 ENCOUNTER — Ambulatory Visit: Payer: Managed Care, Other (non HMO) | Attending: Cardiovascular Disease | Admitting: Cardiovascular Disease

## 2022-01-13 VITALS — BP 132/78 | HR 77 | Ht 72.0 in | Wt 225.0 lb

## 2022-01-13 DIAGNOSIS — E785 Hyperlipidemia, unspecified: Secondary | ICD-10-CM | POA: Diagnosis not present

## 2022-01-13 NOTE — Patient Instructions (Signed)
Medication Instructions:  Your physician recommends that you continue on your current medications as directed. Please refer to the Current Medication list given to you today.  *If you need a refill on your cardiac medications before your next appointment, please call your pharmacy*   Lab Work: Lipids, ALT today If you have labs (blood work) drawn today and your tests are completely normal, you will receive your results only by: MyChart Message (if you have MyChart) OR A paper copy in the mail If you have any lab test that is abnormal or we need to change your treatment, we will call you to review the results.   Testing/Procedures: NONE  Follow-Up: At [facility], you and your health needs are our priority.  As part of our continuing mission to provide you with exceptional heart care, we have created designated Provider Care Teams.  These Care Teams include your primary Cardiologist (physician) and Advanced Practice Providers (APPs -  Physician Assistants and Nurse Practitioners) who all work together to provide you with the care you need, when you need it.  Your next appointment:   1 year(s)  The format for your next appointment:   In Person  Provider:   Kirke Corin, MD      Important Information About Sugar

## 2022-01-13 NOTE — Progress Notes (Signed)
Cardiology Office Note:    Date:  01/13/2022   ID:  John [name], DOB 08-26-1960, MRN [id number]  PCP:  Remi Haggard, FNP  Cardiologist:  Andreanna Mikolajczak  Electrophysiologist:  None   Referring MD: Remi Haggard, FNP   No chief complaint on file.   Previous notes:      John [name] is a 61 y.o. male with a hx of hypertension and hyperlipidemia.  He is seen today for further evaluation of some exertional chest discomfort.  We are asked to see him today by Dr. Humphrey Rolls  for further evaluation of this exertional chest pain.  Started having exertional CP while push mowing his yard in 2019. Would have to stop and rest. Later , he noticed CP while climbing the [place].  CP was achey,  Would last several minutes.  Would have to stop and relax .  Associated with sweats and dyspnea.   No dizziness  Is having to limit his exercise because of this cp .   Was in the hospital at [facility] with cp.   Does not recall whether or not he had a stress test.    October 07, 2019:  John [name] is seen today for follow-up visit.  We saw him several months ago he was having episodes of chest discomfort.  We discussed heart catheterization and also discussed stress testing.  We schedule him for stress test but he never test done.  His sone passed away in August 20, 2022 and did not make it to the stress appt.  Still has some CP.  Occurs with heavy lifting and stress.  Pains last for a minute or so .  He has been having exertional chest pain for the past year or so.  They seem to be progressing.  January 13, 2020: John [name] is seen back today for follow-up of his coronary artery disease.  He status post stenting of his mid LAD.  There was a second diagonal stenosis that could not be wired because of the previously placed LAD stent.  CP is much better,  Has some doe on occasion .   Jan. 21, 2022: John [name] is seen today for work in visit.  He was recently at the dentist and was noted to have carotid  calcifications on xray . 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  Follow-up as needed.   Heart catheterization November 03, 2019 shows that his mid LAD is occluded after the second diagonal.  He has right to left collaterals.  He has a second diagonal vessel that has an 80% stenosis.   The LAD was stented.   This could not be wired through the stent struts because of the acute angle.  The right coronary artery has a mid lesion of 50%. He had stenting across the  LAD CTO by Dr. Irish Lack    Last LDL from Nov 2021 was 52  Was seen by Laurann Montana in 08/20/2022 Was started on Isosorbide Losartan was reduced to 50 mg a day   Still has chest pain  Cannot walk for long distances  Chest pressure , left chest pressure  Helped by stopping and taking NTG .   Unable to do his job because of his cp  and "mental foggyness" with exertion Is out of work at present  Was started on Pralulent by his primary - is not on our list   Will set him up for a cath, Increase coreg to 12.5 BID    Oct 28, 2020  Seen following cath. Personally reviewed Mid LAD stent is patent Branching diag has severe ostial disease just after branching off the LAD - would likely be a difficult PCI. Myoview in May showed no ischemia   January 25, 2021: John [name] is seen today for follow-up visit of his coronary artery disease. Is slightly better.  Still having some chest pain.   Is exertional  His exercise capacity is limited by his chest pain CP with climbing stairs.  Has a jailed Diag that is could be the cause of his chest pain  Myoview in May, 2022 showed no ischemia  Is taking NTG regularly - every 2-3 days  We will increase the carvedilol to 25 mg twice a day and increase  the Ranexa to 1000 mg twice a day starting today.   Feb. 20, 2023 John [name] is seen for follow up of his CAD,  has a mid LAD stent Has a jailed D1 at the stent location Still having some chest pain with exercise.   Is not able to exercise due to the chest pain  Relieved with SL NTG   Nov. 10, 2023  John [name] is seen today for follow up of his CAD  Has a mid LAD stent with a jailed D2 at the stent location  He has been seen in May , 2023 by Dr. Fletcher Anon    Has been approved for Repatha Able to do most of his normal exercises  Had an ER visit last week for CP  Troponins were negative   Still having orthostasis      Past Medical History:  Diagnosis Date   Bell's palsy 07/31/2013   Coronary artery disease    Displacement of lumbar intervertebral disc without myelopathy    High cholesterol    Hypertension     Past Surgical History:  Procedure Laterality Date   CORONARY STENT INTERVENTION N/A 11/03/2019   Procedure: CORONARY STENT INTERVENTION;  Surgeon: Jettie Booze, MD;  Location: [facility];  Service: Cardiovascular;  Laterality: N/A;   INTRAVASCULAR PRESSURE WIRE/FFR STUDY N/A 10/06/2020   Procedure: INTRAVASCULAR PRESSURE WIRE/FFR STUDY;  Surgeon: Leonie Man, MD;  Location: [facility];  Service: Cardiovascular;  Laterality: N/A;   INTRAVASCULAR ULTRASOUND/IVUS N/A 11/03/2019   Procedure: Intravascular Ultrasound/IVUS;  Surgeon: Jettie Booze, MD;  Location: [facility];  Service: Cardiovascular;  Laterality: N/A;   LEFT HEART CATH AND CORONARY ANGIOGRAPHY N/A 11/03/2019   Procedure: LEFT HEART CATH AND CORONARY ANGIOGRAPHY;  Surgeon: Jettie Booze, MD;  Location: [facility];  Service: Cardiovascular;  Laterality: N/A;   LEFT HEART CATH AND CORONARY ANGIOGRAPHY N/A 10/06/2020   Procedure: LEFT HEART CATH AND CORONARY ANGIOGRAPHY;  Surgeon: Leonie Man, MD;  Location: [facility];  Service: Cardiovascular;  Laterality:  N/A;   WRIST SURGERY Right     Current Medications: Current Meds  Medication Sig   albuterol (VENTOLIN HFA) 108 (90 Base) MCG/ACT inhaler Inhale 1-2 puffs into the lungs every 6 (six) hours as needed for wheezing or shortness of breath.   aspirin EC 81 MG tablet Take 81 mg by mouth in the morning.   atorvastatin (LIPITOR) 80 MG tablet TAKE 1 TABLET BY MOUTH  EVERY DAY   cetirizine (ZYRTEC) 10 MG tablet Take 10 mg by mouth in the morning.   Cholecalciferol (VITAMIN D-3) 125 MCG (5000 UT) TABS Take 15,000 Units by mouth in the morning and at bedtime.   ciclopirox (PENLAC) 8 % solution APPLY TOPICALLY AT BEDTIME. APPLY OVER NAIL AND SURROUNDING SKIN. APPLY DAILY OVER PREVIOUS COAT. AFTER SEVEN (7) DAYS, MAY REMOVE WITH ALCOHOL AND CONTINUE CYCLE.   clopidogrel (PLAVIX) 75 MG tablet TAKE 1 TABLET BY MOUTH EVERY DAY   cyclobenzaprine (FLEXERIL) 10 MG tablet Take 1 tablet (10 mg total) by mouth 2 (two) times daily as needed for muscle spasms.   gabapentin (NEURONTIN) 400 MG capsule Take 400 mg by mouth 2 (two) times daily as needed (pain).    ibuprofen (ADVIL) 800 MG tablet Take 800 mg by mouth 3 (three) times daily as needed (pain.).   isosorbide mononitrate (IMDUR) 60 MG 24 hr tablet TAKE 1 TABLET BY MOUTH EVERY DAY   losartan (COZAAR) 100 MG tablet TAKE 1 TABLET BY MOUTH EVERY DAY   nitroGLYCERIN (NITROSTAT) 0.3 MG SL tablet Place 1 tablet under the tongue every 5 (five) minutes x 3 doses as needed for chest pain.   oxyCODONE (ROXICODONE) 5 MG immediate release tablet Take 1 tablet (5 mg total) by mouth every 4 (four) hours as needed for severe pain.   Probiotic Product (ACIDOPHILUS, PROBIOTIC, PO) Take 1 capsule by mouth every evening.   ranolazine (RANEXA) 1000 MG SR tablet Take 1 tablet (1,000 mg total) by mouth 2 (two) times daily.   REPATHA SURECLICK 140 MG/ML SOAJ SMARTSIG:1 injection SUB-Q Every 2 Weeks   [DISCONTINUED] methylPREDNISolone (MEDROL DOSEPAK) 4 MG TBPK tablet See package  instructions     Allergies:   No healthtouch food allergies   Social History   Socioeconomic History   Marital status: Married    Spouse name: Not on file   Number of children: Not on file   Years of education: Not on file   Highest education level: Not on file  Occupational History   Not on file  Tobacco Use   Smoking status: Former   Smokeless tobacco: Never  Substance and Sexual Activity   Alcohol use: Yes    Comment: occasionally    Drug use: No   Sexual activity: Not on file  Other Topics Concern   Not on file  Social History Narrative   Not on file   Social Determinants of Health   Financial Resource Strain: Not on file  Food Insecurity: Not on file  Transportation Needs: Not on file  Physical Activity: Not on file  Stress: Not on file  Social Connections: Not on file     Family History: The patient's family history includes Diabetes in an other family member; Glaucoma in an other family member; High Cholesterol in an other family member; Hypertension in an other family member; Stroke in an other family member.  ROS:   Please see the history of present illness.     All other systems reviewed and are negative.  EKGs/Labs/Other Studies Reviewed:    The following studies were reviewed today:   Recent Labs: 01/10/2022: BUN 10; Creatinine, Ser 0.99; Hemoglobin 15.2; Platelets 214; Potassium 4.2; Sodium 143  Recent Lipid Panel    Component Value Date/Time   CHOL 114 01/13/2020 1013   TRIG 102 01/13/2020 1013   HDL 43 01/13/2020 1013   CHOLHDL 2.7 01/13/2020 1013   LDLCALC 52 01/13/2020 1013    Physical Exam:  Physical Exam: Blood pressure 132/78, pulse 77, height 6' (1.829 m), weight 225 lb (102.1 kg), SpO2 96 %.       GEN:  Well nourished, well developed in no acute distress HEENT: Normal NECK: No JVD; No carotid bruits LYMPHATICS: No lymphadenopathy CARDIAC: RRR , no murmurs, rubs, gallops RESPIRATORY:  Clear to auscultation without rales,  wheezing or rhonchi  ABDOMEN: Soft, non-tender, non-distended MUSCULOSKELETAL:  No edema; No deformity  SKIN: Warm and dry NEUROLOGIC:  Alert and oriented x 3    EKG:        ASSESSMENT:    1. Hyperlipidemia, unspecified hyperlipidemia type   2. Hyperlipidemia LDL goal <70        PLAN:      1.  CAD -   John [name] has known CAD and has a jailed D1 from a previously placed mid LAD stent.   Continues to have some atypical episodes of chest pain.  Recent ER visit had normal troponins.  We will have him return to see Dr. Fletcher Anon n 1 year.  We will continue to alternate seeing him     2.  Hyperlipidemia:    Check lipids, ALT today.     3.   Hypertension:       Blood pressure is well controlled.   Medication Adjustments/Labs and Tests Ordered: Current medicines are reviewed at length with the patient today.  Concerns regarding medicines are outlined above.  Orders Placed This Encounter  Procedures   Lipid panel   ALT    No orders of the defined types were placed in this encounter.    Patient Instructions  Medication Instructions:  Your physician recommends that you continue on your current medications as directed. Please refer to the Current Medication list given to you today.  *If you need a refill on your cardiac medications before your next appointment, please call your pharmacy*   Lab Work: Lipids, ALT today If you have labs (blood work) drawn today and your tests are completely normal, you will receive your results only by: [place] (if you have MyChart) OR A paper copy in the mail If you have any lab test that is abnormal or we need to change your treatment, we will call you to review the results.   Testing/Procedures: NONE  Follow-Up: At [facility], you and your health needs are our priority.  As part of our continuing mission to provide you with exceptional heart care, we have created designated Provider Care Teams.  These Care Teams  include your primary Cardiologist (physician) and Advanced Practice Providers (APPs -  Physician Assistants and Nurse Practitioners) who all work together to provide you with the care you need, when you need it.  Your next appointment:   1 year(s)  The format for your next appointment:   In Person  Provider:   Fletcher Anon, MD      Important Information About Sugar         Signed, Mertie Moores, MD  01/13/2022 2:59 PM    [place]

## 2022-01-14 LAB — LIPID PANEL
Chol/HDL Ratio: 1.6 ratio (ref 0.0–5.0)
Cholesterol, Total: 113 mg/dL (ref 100–199)
HDL: 72 mg/dL (ref >39)
LDL Chol Calc (NIH): 26 mg/dL (ref 0–99)
Triglycerides: 74 mg/dL (ref 0–149)
VLDL Cholesterol Cal: 15 mg/dL (ref 5–40)

## 2022-01-14 LAB — ALT: ALT: 18 IU/L (ref 0–44)

## 2022-01-19 ENCOUNTER — Other Ambulatory Visit: Payer: Self-pay | Admitting: Cardiovascular Disease

## 2022-02-02 ENCOUNTER — Ambulatory Visit: Payer: Managed Care, Other (non HMO) | Admitting: Cardiovascular Disease

## 2022-03-16 ENCOUNTER — Other Ambulatory Visit: Payer: Self-pay | Admitting: Student

## 2022-03-16 DIAGNOSIS — T148XXA Other injury of unspecified body region, initial encounter: Secondary | ICD-10-CM

## 2022-04-06 ENCOUNTER — Encounter: Payer: Self-pay | Admitting: Student

## 2022-04-07 ENCOUNTER — Ambulatory Visit
Admission: RE | Admit: 2022-04-07 | Discharge: 2022-04-07 | Disposition: A | Discharge status: Home / Self Care | Payer: Managed Care, Other (non HMO) | Source: Ambulatory Visit | Attending: Student | Admitting: Student

## 2022-04-07 DIAGNOSIS — T148XXA Other injury of unspecified body region, initial encounter: Secondary | ICD-10-CM

## 2022-04-19 ENCOUNTER — Other Ambulatory Visit: Payer: Self-pay | Admitting: Cardiovascular Disease

## 2022-04-24 ENCOUNTER — Ambulatory Visit (INDEPENDENT_AMBULATORY_CARE_PROVIDER_SITE_OTHER): Payer: Managed Care, Other (non HMO) | Admitting: Orthopedic Surgery

## 2022-04-24 ENCOUNTER — Encounter: Payer: Self-pay | Admitting: Orthopedic Surgery

## 2022-04-24 ENCOUNTER — Ambulatory Visit (INDEPENDENT_AMBULATORY_CARE_PROVIDER_SITE_OTHER): Payer: Managed Care, Other (non HMO)

## 2022-04-24 VITALS — BP 152/89 | HR 71 | Ht 72.0 in | Wt 225.0 lb

## 2022-04-24 DIAGNOSIS — M542 Cervicalgia: Secondary | ICD-10-CM

## 2022-04-24 DIAGNOSIS — M5412 Radiculopathy, cervical region: Secondary | ICD-10-CM | POA: Diagnosis not present

## 2022-04-24 NOTE — Progress Notes (Signed)
Orthopedic Spine Surgery Office Note  Assessment: Patient is a 62 y.o. male with left arm pain along the ulnar forearm and ulnar 2 digits.  Has positive shoulder abduction sign. MRI shows foraminal stenosis on the left side at C6/7 and C7/T1. Symptoms seem consistent with cervical radiculopathy.    Plan: -Explained that initially conservative treatment is tried as a significant number of patients may experience relief with these treatment modalities. Discussed that the conservative treatments include:  -activity modification  -physical therapy  -over the counter pain medications  -medrol dosepak  -cervical steroid injections -Patient has tried physical therapy, over-the-counter medications, oral steroids, gabapentin, narcotics -Recommended cervical epidural steroid injection since this is one of the few remaining nonoperative treatments he has not tried.  Since he does not want surgery and they are really not any further nonoperative treatment to try after this injection, would recommend he try pain management if he still is not interested in surgery -Patient will follow-up on an as-needed basis   Patient expressed understanding of the plan and all questions were answered to the patient's satisfaction.   ___________________________________________________________________________   History:  Patient is a 62 y.o. male who presents today for left arm pain.  Patient had onset of left shoulder and arm pain starting on 01/10/2022.  There is no trauma or injury that brought on the pain.  Pain is felt on the lateral aspect of his left shoulder and goes down into his left ulnar forearm and then into his left ulnar 2 digits.  His shoulder pain has gotten better with time but is still present.  He did have numbness and tingling in the ulnar 2 digits on the left side but that has also resolved.  His pain is an 8 out of 10 at its worst.  It does wax and wane in terms of intensity.  He feels the pain on a  daily basis.  Overall, the pain has gotten better since it initially started.  He does notice relief if he puts his arm above his shoulder level.  No symptoms on the right side.  Not having any neck pain.   Weakness: Feels generally fatigued, but no specific extremity weakness Difficulty with fine motor skills (e.g., buttoning shirts, handwriting): Denies Symptoms of imbalance: Denies Paresthesias and numbness: Had paresthesias and numbness in the left ulnar 2 digits but that has resolved.  No other numbness or paresthesias Bowel or bladder incontinence: Denies Saddle anesthesia: Denies  Treatments tried: Physical therapy, over-the-counter medications, oral steroids, gabapentin, narcotic medication  Review of systems: Denies fevers and chills, night sweats, unexplained weight loss, history of cancer.  Has had pain that wakes him at night  Past medical history: HLD HTN CAD Depression  Allergies: NKDA  Past surgical history:  Cardiac catheterization  Social history: Denies use of nicotine product (smoking, vaping, patches, smokeless) Alcohol use: Denies Denies recreational drug use  Physical Exam:  General: no acute distress, appears stated age Neurologic: alert, answering questions appropriately, following commands Respiratory: unlabored breathing on room air, symmetric chest rise Psychiatric: appropriate affect, normal cadence to speech   MSK (spine):  -Strength exam      Left  Right Grip strength                5/5  5/5 Interosseus   5/5   5/5 Wrist extension  5/5  5/5 Wrist flexion   5/5  5/5 Elbow flexion   5/5  5/5 Deltoid    5/5  5/5  EHL  5/5  5/5 TA    5/5  5/5 GSC    5/5  5/5 Knee extension  5/5  5/5 Hip flexion   5/5  5/5  -Sensory exam    Sensation intact to light touch in L3-S1 nerve distributions of bilateral lower extremities  Sensation intact to light touch in C5-T1 nerve distributions of bilateral upper extremities  -Brachioradialis DTR:  2/4 on the left, 2/4 on the right -Biceps DTR: 2/4 on the left, 2/4 on the right -Achilles DTR: 2/4 on the left, 2/4 on the right -Patellar tendon DTR: 2/4 on the left, 2/4 on the right  -Spurling: Negative bilaterally -Hoffman sign: Negative bilaterally -Clonus: No beats bilaterally  -Interosseous wasting: None seen -Grip and release test: Negative -Romberg: Negative -Gait: Normal -Imbalance with tandem gait: No -Positive shoulder abduction relief sign  Left upper extremity exam: No pain through range of motion, negative Jobe test, no weakness with external rotation with arm at side, negative belly press, no reproduced pain with extension of the middle finger, no tenderness to palpation over the medial or lateral epicondyle Right shoulder exam: No pain through range of motion  Tinel's at wrist: Negative bilaterally Phalen's at wrist: Negative bilaterally Durkan's: Negative bilaterally  Tinel's at elbow: Negative bilaterally Phalen's at elbow: Negative bilaterally  Imaging: XR of the cervical spine from 04/24/2022 was independently reviewed and interpreted, showing disc height loss at C6-7 and C7/T1.  Anterolisthesis at C7/T1 that does not shift between flexion and extension views. No fracture or dislocation seen.  No evidence of instability on flexion/extension views.  MRI of the cervical spine from 04/07/2022 was independently reviewed and interpreted, showing left C6/7 foraminal stenosis and left C7/T1 foraminal stenosis. Right C5/6 foraminal stenosis.    Patient name: John [name] Patient MRN: [id number] Date of visit: 04/24/22

## 2022-04-25 ENCOUNTER — Telehealth: Payer: Self-pay | Admitting: Orthopedic Surgery

## 2022-04-25 NOTE — Telephone Encounter (Signed)
Patient called in stating his job will be sending a medical release form Via Fax today and it will need to be filled out and faxed back with his restrictions, please advise

## 2022-04-25 NOTE — Telephone Encounter (Signed)
Waiting on form

## 2022-04-27 ENCOUNTER — Telehealth: Payer: Self-pay | Admitting: *Deleted

## 2022-04-27 NOTE — Telephone Encounter (Signed)
   Pre-operative Risk Assessment    Patient Name: John [name]  DOB: Sep 24, 1960 MRN: [id number]      Request for Surgical Clearance    Procedure:   EPIDURAL STEROID INJECTION  Date of Surgery:  Clearance TBD                                 Surgeon:  DR. Ernestina Patches Surgeon's Group or Practice Name:  [facility] Phone number:  [phone] Fax number:  [phone]   Type of Clearance Requested:   - Pharmacy:  Hold Clopidogrel (Plavix) X'7 DAYS PRIOR   Type of Anesthesia:  Not Indicated   Additional requests/questions:    [name]   04/27/2022, 10:03 AM

## 2022-04-27 NOTE — Telephone Encounter (Signed)
Form faxed back.

## 2022-04-28 NOTE — Telephone Encounter (Signed)
   Patient Name: John [name]  DOB: Nov 08, 1960 MRN: [id number]  Primary Cardiologist: Mertie Moores, MD  Chart reviewed as part of pre-operative protocol coverage. Given past medical history and time since last visit, based on ACC/AHA guidelines, John [name] is at acceptable risk for the planned procedure without further cardiovascular testing.   Per Dr. Acie Fredrickson, primary cardiologist, he may hold his Plavix for 5-7 days prior to procedure. Please resume Plavix as soon as possible postprocedure, at the discretion of the surgeon.   I will route this recommendation to the requesting party via Epic fax function and remove from pre-op pool.  Please call with questions.  Lenna Sciara, NP 04/28/2022, 9:41 AM

## 2022-05-08 ENCOUNTER — Ambulatory Visit: Payer: Self-pay

## 2022-05-08 ENCOUNTER — Ambulatory Visit: Payer: Managed Care, Other (non HMO) | Admitting: Physical Medicine and Rehabilitation

## 2022-05-08 VITALS — BP 133/78 | HR 60

## 2022-05-08 DIAGNOSIS — M5412 Radiculopathy, cervical region: Secondary | ICD-10-CM

## 2022-05-08 MED ORDER — METHYLPREDNISOLONE ACETATE 80 MG/ML IJ SUSP
80.0000 mg | Freq: Once | INTRAMUSCULAR | Status: AC
  Administered 2022-05-08: 80 mg

## 2022-05-08 NOTE — Progress Notes (Signed)
Functional Pain Scale - descriptive words and definitions  Unmanageable (7)  Pain interferes with normal ADL's/nothing seems to help/sleep is very difficult/active distractions are very difficult to concentrate on. Severe range order  Average Pain  varies   +Driver, -BT, -Dye Allergies.  Left sided neck pain that radiates down left arm

## 2022-05-08 NOTE — Patient Instructions (Signed)

## 2022-05-14 ENCOUNTER — Other Ambulatory Visit: Payer: Self-pay | Admitting: Cardiovascular Disease

## 2022-05-15 NOTE — Progress Notes (Signed)
John [name] - 62 y.o. male MRN [id number]  Date of birth: [birth year]  Office Visit Note: Visit Date: 05/08/2022 PCP: Remi Haggard, FNP Referred by: Callie Fielding, MD  Subjective: Chief Complaint  Patient presents with   Neck - Pain   HPI:  John [name] is a 62 y.o. male who comes in today at the request of Dr. Ileene Rubens for planned Left C7-T1 Cervical Interlaminar epidural steroid injection with fluoroscopic guidance.  The patient has failed conservative care including home exercise, medications, time and activity modification.  This injection will be diagnostic and hopefully therapeutic.  Please see requesting physician notes for further details and justification.   ROS Otherwise per HPI.  Assessment & Plan: Visit Diagnoses:    ICD-10-CM   1. Cervical radiculopathy  M54.12 XR C-ARM NO REPORT    Epidural Steroid injection    methylPREDNISolone acetate (DEPO-MEDROL) injection 80 mg      Plan: No additional findings.   Meds & Orders:  Meds ordered this encounter  Medications   methylPREDNISolone acetate (DEPO-MEDROL) injection 80 mg    Orders Placed This Encounter  Procedures   XR C-ARM NO REPORT   Epidural Steroid injection    Follow-up: Return for visit to requesting provider as needed.   Procedures: No procedures performed  Cervical Epidural Steroid Injection - Interlaminar Approach with Fluoroscopic Guidance  Patient: John [name]      Date of Birth: 07/02/1960 MRN: [id number] PCP: Remi Haggard, FNP      Visit Date: 05/08/2022   Universal Protocol:    Date/Time: 03/11/245:28 AM  Consent Given By: the patient  Position: PRONE  Additional Comments: Vital signs were monitored before and after the procedure. Patient was prepped and draped in the usual sterile fashion. The correct patient, procedure, and site was verified.   Injection Procedure Details:   Procedure diagnoses: Cervical radiculopathy [M54.12]    Meds  Administered:  Meds ordered this encounter  Medications   methylPREDNISolone acetate (DEPO-MEDROL) injection 80 mg     Laterality: Left  Location/Site: C7-T1  Needle: 3.5 in., 20 ga. Tuohy  Needle Placement: Paramedian epidural space  Findings:  -Comments: Excellent flow of contrast into the epidural space.  Procedure Details: Using a paramedian approach from the side mentioned above, the region overlying the inferior lamina was localized under fluoroscopic visualization and the soft tissues overlying this structure were infiltrated with 4 ml. of 1% Lidocaine without Epinephrine. A # 20 gauge, Tuohy needle was inserted into the epidural space using a paramedian approach.  The epidural space was localized using loss of resistance along with contralateral oblique bi-planar fluoroscopic views.  After negative aspirate for air, blood, and CSF, a 2 ml. volume of Isovue-250 was injected into the epidural space and the flow of contrast was observed. Radiographs were obtained for documentation purposes.   The injectate was administered into the level noted above.  Additional Comments:  The patient tolerated the procedure well Dressing: 2 x 2 sterile gauze and Band-Aid    Post-procedure details: Patient was observed during the procedure. Post-procedure instructions were reviewed.  Patient left the clinic in stable condition.   Clinical History: MRI CERVICAL SPINE WITHOUT CONTRAST   TECHNIQUE: Multiplanar, multisequence MR imaging of the cervical spine was performed. No intravenous contrast was administered.   COMPARISON:  09/10/2013 MRI cervical spine   FINDINGS: Alignment: Trace anterolisthesis of C7 on T1.   Vertebrae: No fracture, evidence of discitis, or bone lesion. Congenitally short pedicles, which  narrow the AP diameter of the spinal canal.   Cord: Normal signal and morphology.   Posterior Fossa, vertebral arteries, paraspinal tissues: Negative.   Disc levels:    C2-C3: Small central disc protrusion. Facet arthropathy. No significant spinal canal stenosis or neural foraminal narrowing.   C3-C4: Small central disc protrusion. Facet arthropathy. Ligamentum flavum hypertrophy. Mild spinal canal stenosis and mild right neural foraminal narrowing, which have progressed since prior exam.   C4-C5: No significant disc bulge. Facet and uncovertebral hypertrophy. Ligamentum flavum hypertrophy. Mild spinal canal stenosis, which has progressed since prior exam. No neural foraminal narrowing.   C5-C6: Minimal disc bulge. Facet and uncovertebral hypertrophy. Ligamentum flavum hypertrophy. Mild spinal canal stenosis, mild-to-moderate right neural foraminal narrowing, and mild left neural foraminal narrowing, which have progressed since the prior exam.   C6-C7: Mild disc bulge. Ligamentum flavum hypertrophy. Facet and uncovertebral hypertrophy. Mild-to-moderate spinal canal stenosis, which has progressed since the prior exam. Mild right and moderate left neural foraminal narrowing, unchanged.   C7-T1: Trace anterolisthesis with disc unroofing. Facet and uncovertebral hypertrophy. No spinal canal stenosis. Moderate left neural foraminal narrowing, which has progressed since prior exam.   IMPRESSION: 1. C6-C7 mild-to-moderate spinal canal stenosis with moderate left and mild right neural foraminal narrowing. 2. C5-C6 mild spinal canal stenosis, mild-to-moderate right neural foraminal narrowing, and mild left neural foraminal narrowing. 3. C7-T1 moderate left neural foraminal narrowing. 4. C3-C4 mild spinal canal stenosis and mild right neural foraminal narrowing. 5. C4-C5 mild spinal canal stenosis.     Electronically Signed   By: Merilyn Baba M.D.   On: 04/07/2022 20:41     Objective:  VS:  HT:    WT:   BMI:     BP:133/78  HR:60bpm  TEMP: ( )  RESP:  Physical Exam Vitals and nursing note reviewed.  Constitutional:      General: He is  not in acute distress.    Appearance: Normal appearance. He is not ill-appearing.  HENT:     Head: Normocephalic and atraumatic.     Right Ear: External ear normal.     Left Ear: External ear normal.  Eyes:     Extraocular Movements: Extraocular movements intact.  Cardiovascular:     Rate and Rhythm: Normal rate.     Pulses: Normal pulses.  Abdominal:     General: There is no distension.     Palpations: Abdomen is soft.  Musculoskeletal:        General: No signs of injury.     Cervical back: Neck supple. Tenderness present. No rigidity.     Right lower leg: No edema.     Left lower leg: No edema.     Comments: Patient has good strength in the upper extremities with 5 out of 5 strength in wrist extension long finger flexion APB.  No intrinsic hand muscle atrophy.  Negative Hoffmann's test.  Lymphadenopathy:     Cervical: No cervical adenopathy.  Skin:    Findings: No erythema or rash.  Neurological:     General: No focal deficit present.     Mental Status: He is alert and oriented to person, place, and time.     Sensory: No sensory deficit.     Motor: No weakness or abnormal muscle tone.     Coordination: Coordination normal.  Psychiatric:        Mood and Affect: Mood normal.        Behavior: Behavior normal.      Imaging: No results found.

## 2022-05-15 NOTE — Procedures (Signed)
Cervical Epidural Steroid Injection - Interlaminar Approach with Fluoroscopic Guidance  Patient: John [name]      Date of Birth: 22-Jun-1960 MRN: [id number] PCP: Remi Haggard, FNP      Visit Date: 05/08/2022   Universal Protocol:    Date/Time: 03/11/245:28 AM  Consent Given By: the patient  Position: PRONE  Additional Comments: Vital signs were monitored before and after the procedure. Patient was prepped and draped in the usual sterile fashion. The correct patient, procedure, and site was verified.   Injection Procedure Details:   Procedure diagnoses: Cervical radiculopathy [M54.12]    Meds Administered:  Meds ordered this encounter  Medications   methylPREDNISolone acetate (DEPO-MEDROL) injection 80 mg     Laterality: Left  Location/Site: C7-T1  Needle: 3.5 in., 20 ga. Tuohy  Needle Placement: Paramedian epidural space  Findings:  -Comments: Excellent flow of contrast into the epidural space.  Procedure Details: Using a paramedian approach from the side mentioned above, the region overlying the inferior lamina was localized under fluoroscopic visualization and the soft tissues overlying this structure were infiltrated with 4 ml. of 1% Lidocaine without Epinephrine. A # 20 gauge, Tuohy needle was inserted into the epidural space using a paramedian approach.  The epidural space was localized using loss of resistance along with contralateral oblique bi-planar fluoroscopic views.  After negative aspirate for air, blood, and CSF, a 2 ml. volume of Isovue-250 was injected into the epidural space and the flow of contrast was observed. Radiographs were obtained for documentation purposes.   The injectate was administered into the level noted above.  Additional Comments:  The patient tolerated the procedure well Dressing: 2 x 2 sterile gauze and Band-Aid    Post-procedure details: Patient was observed during the procedure. Post-procedure instructions were  reviewed.  Patient left the clinic in stable condition.

## 2022-06-01 ENCOUNTER — Telehealth: Payer: Self-pay | Admitting: Cardiovascular Disease

## 2022-06-01 NOTE — Telephone Encounter (Signed)
   Pre-operative Risk Assessment    Patient Name: John [name]  DOB: Nov 15, 1960 MRN: [id number]     Request for Surgical Clearance    Procedure:  Dental Extraction - Amount of Teeth to be Pulled:  2  Date of Surgery:  Clearance 06/07/22                                 Surgeon:  Denton Lank Surgeon's Group or Practice Name:  [facility] Phone number:  [phone] Fax number:  [phone]   Type of Clearance Requested:   - Pharmacy:  Hold Clopidogrel (Plavix)     Type of Anesthesia:  Local   Office wants to know how many days the pt needs to be off of blood thinners, or if he can be off.  Additional requests/questions:    Oran Rein   06/01/2022, 4:35 PM

## 2022-06-01 NOTE — Telephone Encounter (Signed)
   Patient Name: John [name]  DOB: [birth year] MRN: [id number]  Primary Cardiologist: Mertie Moores, MD  Chart reviewed as part of pre-operative protocol coverage.   Simple dental extractions (i.e. 1-2 teeth) are considered low risk procedures per guidelines and generally do not require any specific cardiac clearance. It is also generally accepted that for simple extractions and dental cleanings, there is no need to interrupt blood thinner therapy.  SBE prophylaxis is not required for the patient from a cardiac standpoint.  I will route this recommendation to the requesting party via Epic fax function and remove from pre-op pool.  Please call with questions.  Lenna Sciara, NP 06/01/2022, 4:44 PM

## 2022-06-05 ENCOUNTER — Ambulatory Visit (INDEPENDENT_AMBULATORY_CARE_PROVIDER_SITE_OTHER): Payer: Managed Care, Other (non HMO) | Admitting: Orthopedic Surgery

## 2022-06-05 DIAGNOSIS — M5412 Radiculopathy, cervical region: Secondary | ICD-10-CM | POA: Diagnosis not present

## 2022-06-05 NOTE — Progress Notes (Signed)
Orthopedic Spine Surgery Office Note  Assessment: Patient is a 62 y.o. male with left dorsal forearm pain.  Has foraminal stenosis on the left at C6/7 and C7/T1.  Possible radiculopathy   Plan:  -Explained that initially conservative treatment is tried as a significant number of patients may experience relief with these treatment modalities. Discussed that the conservative treatments include:  -activity modification  -physical therapy  -over the counter pain medications  -medrol dosepak  -cervical steroid injections -Patient has tried physical therapy, over-the-counter medications, oral steroids, gabapentin, narcotic medication, cervical injection -He is still not interested in surgery and was interested in another injection since he got significant relief, so a referral was provided for repeat cervical injection -Would get an EMG/NCS prior to surgical intervention if he changes his mind about surgery -Patient should return to office on as-needed basis   Patient expressed understanding of the plan and all questions were answered to the patient's satisfaction.   __________________________________________________________________________  History: Patient is a 62 y.o. male who has been previously seen in the office for symptoms consistent with cervical radiculopathy.  He wanted to clarify further his symptoms.  He states that he has left shoulder and left dorsal forearm pain.  In the beginning, the pain radiated all the way from the neck down to the ulnar 2 digits.  Most of that is resolved and now has mostly left shoulder and left dorsal forearm pain.  He initially got relief with putting his hand on his head but that is no longer the case.  He still has no right-sided symptoms.  After our last visit, he had an injection to the cervical spine and noted significant relief.  He got most of the relief in the shoulder.  He states he no longer has any shoulder pain.  He did not get good relief of the  dorsal forearm pain now.  Denies paresthesias and numbness.  No bowel or bladder incontinence.  No saddle anesthesia.  Previous treatments: physical therapy, over-the-counter medications, oral steroids, gabapentin, narcotic medication, cervical injection  Physical Exam:  General: no acute distress, appears stated age Neurologic: alert, answering questions appropriately, following commands Respiratory: unlabored breathing on room air, symmetric chest rise Psychiatric: appropriate affect, normal cadence to speech   MSK (spine):  -Strength exam      Left  Right Grip strength                5/5  5/5 Interosseus   5/5   5/5 Wrist extension  5/5  5/5 Wrist flexion   5/5  5/5 Elbow flexion   5/5  5/5 Deltoid    5/5  5/5  EHL    5/5  5/5 TA    5/5  5/5 GSC    5/5  5/5 Knee extension  5/5  5/5 Hip flexion   5/5  5/5  -Sensory exam    Sensation intact to light touch in L3-S1 nerve distributions of bilateral lower extremities  Sensation intact to light touch in C5-T1 nerve distributions of bilateral upper extremities  -Brachioradialis DTR: 2/4 on the left, 2/4 on the right -Biceps DTR: 2/4 on the left, 2/4 on the right -Achilles DTR: 2/4 on the left, 2/4 on the right -Patellar tendon DTR: 2/4 on the left, 2/4 on the right  -Spurling: Negative bilaterally -Hoffman sign: Negative bilaterally -Clonus: No beats bilaterally  -Interosseous wasting: None seen -Grip and release test: Negative -Romberg: Negative -Gait: Normal -Imbalance with tandem gait: No -Positive shoulder abduction relief sign  Left shoulder exam: No pain through range of motion, negative Jobe, negative belly press, no weakness with external rotation with arm at side, no tenderness palpation over the shoulder, no tenderness to palpation over the left forearm or either epicondyle at the elbow Right shoulder exam: No pain through range of motion  Tinel's at wrist: Negative bilaterally Phalen's at wrist: Negative  bilaterally Durkan's: Negative bilaterally   Tinel's at elbow: Negative bilaterally Phalen's at elbow: Negative bilaterally  Imaging: XR of the cervical spine from 04/24/2022 was previously independently reviewed and interpreted, showing disc height loss at C6-7 and C7/T1.  Anterolisthesis at C7/T1 that does not shift between flexion and extension views. No fracture or dislocation seen.  No evidence of instability on flexion/extension views.   MRI of the cervical spine from 04/07/2022 was previously independently reviewed and interpreted, showing left C6/7 foraminal stenosis and left C7/T1 foraminal stenosis. Right C5/6 foraminal stenosis.     Patient name: John [name] Patient MRN: [id number] Date of visit: 06/05/22

## 2022-06-06 ENCOUNTER — Telehealth: Payer: Self-pay | Admitting: Cardiovascular Disease

## 2022-06-06 NOTE — Telephone Encounter (Signed)
Office calling to f/u on Clearance for pt being that he is schedule for tomorrow. Please advise

## 2022-06-07 NOTE — Telephone Encounter (Signed)
Spoke with Jenny Reichmann from [facility] and informed her that I would fax over the cardiac clearance recommendations for the patient. She verbalized understanding and thanked me for the call.

## 2022-06-07 NOTE — Telephone Encounter (Signed)
Caller is following-up on patient's clearance to have extractions.  Caller wants to know how long to hold patient's clopidogrel (PLAVIX) 75 MG tablet.

## 2022-06-09 ENCOUNTER — Other Ambulatory Visit: Payer: Self-pay | Admitting: Cardiovascular Disease

## 2022-06-12 ENCOUNTER — Telehealth: Payer: Self-pay | Admitting: Physical Medicine and Rehabilitation

## 2022-06-12 ENCOUNTER — Telehealth: Payer: Self-pay | Admitting: *Deleted

## 2022-06-12 NOTE — Telephone Encounter (Signed)
   Pre-operative Risk Assessment    Patient Name: John [name]  DOB: 11/04/1960 MRN: [id number]    THIS IS A NEW REQUEST; PT GETS INJECTIONS EVERY FEW MONTHS   Request for Surgical Clearance    Procedure:   EPIDURAL STEROID INJECTION  Date of Surgery:  Clearance TBD                                 Surgeon:  DR. Jerel Shepherd Group or Practice Name:  [facility] [place] Phone number:  [phone] ATTN: Lowanda Foster Fax number:  [phone]   Type of Clearance Requested:   - Medical  - Pharmacy:  Hold Clopidogrel (Plavix) x 7 DAYS PRIOR TO PROCEDURE   Type of Anesthesia:  Not Indicated   Additional requests/questions:    [name]   06/12/2022, 2:19 PM

## 2022-06-12 NOTE — Telephone Encounter (Signed)
Call transferred to Adelfa Koh,

## 2022-06-12 NOTE — Telephone Encounter (Signed)
Pt has been scheduled for tele pre op appt 06/14/22 due to med hold and procedure date. Pt has been instructed to not take his Plavix as of 06/14/22 as this will be a 7 days hold prior to his procedure. Med rec and consent are done.

## 2022-06-12 NOTE — Telephone Encounter (Signed)
Pt has been scheduled for tele pre op appt 06/14/22 due to med hold and procedure date. Pt has been instructed to not take his Plavix as of 06/14/22 as this will be a 7 days hold prior to his procedure. Med rec and consent are done.     Patient Consent for Virtual Visit        KREIGHTON BEANS has provided verbal consent on 06/12/2022 for a virtual visit (video or telephone).   CONSENT FOR VIRTUAL VISIT FOR:  John [name]  By participating in this virtual visit I agree to the following:  I hereby voluntarily request, consent and authorize [facility] and its employed or contracted physicians, physician assistants, nurse practitioners or other licensed health care professionals (the Practitioner), to provide me with telemedicine health care services (the "Services") as deemed necessary by the treating Practitioner. I acknowledge and consent to receive the Services by the Practitioner via telemedicine. I understand that the telemedicine visit will involve communicating with the Practitioner through live audiovisual communication technology and the disclosure of certain medical information by electronic transmission. I acknowledge that I have been given the opportunity to request an in-person assessment or other available alternative prior to the telemedicine visit and am voluntarily participating in the telemedicine visit.  I understand that I have the right to withhold or withdraw my consent to the use of telemedicine in the course of my care at any time, without affecting my right to future care or treatment, and that the Practitioner or I may terminate the telemedicine visit at any time. I understand that I have the right to inspect all information obtained and/or recorded in the course of the telemedicine visit and may receive copies of available information for a reasonable fee.  I understand that some of the potential risks of receiving the Services via telemedicine include:  Delay or  interruption in medical evaluation due to technological equipment failure or disruption; Information transmitted may not be sufficient (e.g. poor resolution of images) to allow for appropriate medical decision making by the Practitioner; and/or  In rare instances, security protocols could fail, causing a breach of personal health information.  Furthermore, I acknowledge that it is my responsibility to provide information about my medical history, conditions and care that is complete and accurate to the best of my ability. I acknowledge that Practitioner's advice, recommendations, and/or decision may be based on factors not within their control, such as incomplete or inaccurate data provided by me or distortions of diagnostic images or specimens that may result from electronic transmissions. I understand that the practice of medicine is not an exact science and that Practitioner makes no warranties or guarantees regarding treatment outcomes. I acknowledge that a copy of this consent can be made available to me via my patient portal [facility] MyChart), or I can request a printed copy by calling the office of [facility].    I understand that my insurance will be billed for this visit.   I have read or had this consent read to me. I understand the contents of this consent, which adequately explains the benefits and risks of the Services being provided via telemedicine.  I have been provided ample opportunity to ask questions regarding this consent and the Services and have had my questions answered to my satisfaction. I give my informed consent for the services to be provided through the use of telemedicine in my medical care

## 2022-06-12 NOTE — Telephone Encounter (Signed)
   Name: John [name]  DOB: [birth year]  MRN: [id number]  Primary Cardiologist: Kristeen Miss, MD   Preoperative team, please contact this patient and set up a phone call appointment for further preoperative risk assessment. Please obtain consent and complete medication review. Thank you for your help.  I confirm that guidance regarding antiplatelet and oral anticoagulation therapy has been completed and, if necessary, noted below.  His Plavix may be held for 5-7 days prior to his procedure.  Please resume as soon as hemostasis is achieved.   Ronney Asters, NP 06/12/2022, 3:30 PM [facility]

## 2022-06-12 NOTE — Telephone Encounter (Signed)
Spoke with patient's wife and informed her that blood thinner has to be stopped on 06/14/22

## 2022-06-12 NOTE — Telephone Encounter (Signed)
Patient's wife called. Would like to know if patient needs to be off the blood thinners and how many days if so?

## 2022-06-14 ENCOUNTER — Ambulatory Visit (INDEPENDENT_AMBULATORY_CARE_PROVIDER_SITE_OTHER): Payer: Managed Care, Other (non HMO)

## 2022-06-14 ENCOUNTER — Telehealth: Payer: Self-pay | Admitting: *Deleted

## 2022-06-14 DIAGNOSIS — Z01810 Encounter for preprocedural cardiovascular examination: Secondary | ICD-10-CM

## 2022-06-14 NOTE — Telephone Encounter (Signed)
-----   Message from Carlos Levering, NP sent at 06/14/2022 10:42 AM EDT ----- Patient with complaints of dizziness and presyncope. He will need in office visit. No charged for this visit.   Thank you!  DW

## 2022-06-14 NOTE — Telephone Encounter (Signed)
I s/w the pt per the pre op APP today John Levering, NP pt c/o dizziness and will need in office appt for dizziness and pre op clearance. Pt agreeable to appt 06/16/22 @ 2:45 with Robin Searing, NP. Pt has been advised if his symptoms get worse he is to go the ED. Pt verbalized understanding to plan of care. I will update all parties involved.

## 2022-06-14 NOTE — Telephone Encounter (Signed)
I s/w the pt per the pre op APP today Deborah Wittenborn, NP pt c/o dizziness and will need in office appt for dizziness and pre op clearance. Pt agreeable to appt 06/16/22 @ 2:45 with Ernest Dick, NP. Pt has been advised if his symptoms get worse he is to go the ED. Pt verbalized understanding to plan of care. I will update all parties involved.  

## 2022-06-14 NOTE — Progress Notes (Signed)
Virtual Visit via Telephone Note   Because of John [name] co-morbid illnesses, he is at least at moderate risk for complications without adequate follow up.  This format is felt to be most appropriate for this patient at this time.  The patient did not have access to video technology/had technical difficulties with video requiring transitioning to audio format only (telephone).  All issues noted in this document were discussed and addressed.  No physical exam could be performed with this format.  Please refer to the patient's chart for his consent to telehealth for [facility].  Evaluation Performed:  Preoperative cardiovascular risk assessment _____________   Date:  06/14/2022   Patient ID:  John [name], DOB 1962/07/64, MRN [id number] Patient Location:  Home Provider location:   Office  Primary Care Provider:  Armando Gang, FNP Primary Cardiologist:  Kristeen Miss, MD  Chief Complaint / Patient Profile   62 y.o. y/o male with a h/o CAD s/p PCI with DES mid LAD August 2021, chronic atypical chest pain, hypertension, hyperlipidemia who is pending epidural steroid injection by Dr. Alvester Morin and presents today for telephonic preoperative cardiovascular risk assessment.  History of Present Illness    John [name] is a 62 y.o. male who presents via audio/video conferencing for a telehealth visit today.  Pt was last seen in cardiology clinic on 01/13/2022 by Dr. Elease Hashimoto.  At that time John [name] was doing well from a cardiac standpoint.  The patient is now pending procedure as outlined above. Since his last visit, he reports worsening issues with positional dizziness. He states "it is getting worse and worse" to the point that he had a presyncopal event in the shower. This has been going on for about a year and he has discussed it with Dr. Melburn Popper and his PCP and was told to change positions slowly but he does not feel this is working for him. Will have patient  scheduled for in office visit given worsening issues.   Past Medical History    Past Medical History:  Diagnosis Date   Bell's palsy 07/31/2013   Coronary artery disease    Displacement of lumbar intervertebral disc without myelopathy    High cholesterol    Hypertension    Past Surgical History:  Procedure Laterality Date   CORONARY PRESSURE/FFR STUDY N/A 10/06/2020   Procedure: INTRAVASCULAR PRESSURE WIRE/FFR STUDY;  Surgeon: Marykay Lex, MD;  Location: [facility] INVASIVE CV LAB;  Service: Cardiovascular;  Laterality: N/A;   CORONARY STENT INTERVENTION N/A 11/03/2019   Procedure: CORONARY STENT INTERVENTION;  Surgeon: Corky Crafts, MD;  Location: [facility] INVASIVE CV LAB;  Service: Cardiovascular;  Laterality: N/A;   CORONARY ULTRASOUND/IVUS N/A 11/03/2019   Procedure: Intravascular Ultrasound/IVUS;  Surgeon: Corky Crafts, MD;  Location: [facility] INVASIVE CV LAB;  Service: Cardiovascular;  Laterality: N/A;   LEFT HEART CATH AND CORONARY ANGIOGRAPHY N/A 11/03/2019   Procedure: LEFT HEART CATH AND CORONARY ANGIOGRAPHY;  Surgeon: Corky Crafts, MD;  Location: [facility] INVASIVE CV LAB;  Service: Cardiovascular;  Laterality: N/A;   LEFT HEART CATH AND CORONARY ANGIOGRAPHY N/A 10/06/2020   Procedure: LEFT HEART CATH AND CORONARY ANGIOGRAPHY;  Surgeon: Marykay Lex, MD;  Location: [facility] INVASIVE CV LAB;  Service: Cardiovascular;  Laterality: N/A;   WRIST SURGERY Right     Allergies  Allergies  Allergen Reactions   No [employer] Food Allergies     Shellfish-allergy testing Pecans-allergy testing [place]-allergy testing cinnamon    Home Medications  Prior to Admission medications   Medication Sig Start Date End Date Taking? Authorizing Provider  albuterol (VENTOLIN HFA) 108 (90 Base) MCG/ACT inhaler Inhale 1-2 puffs into the lungs every 6 (six) hours as needed for wheezing or shortness of breath. Patient not taking: Reported on 06/12/2022    [provider]  aspirin EC 81 MG  tablet Take 81 mg by mouth in the morning.    [provider]  atorvastatin (LIPITOR) 80 MG tablet TAKE 1 TABLET BY MOUTH EVERY DAY 11/14/21   Nahser, Deloris Ping, MD  carvedilol (COREG) 25 MG tablet TAKE 1 TABLET BY MOUTH TWICE A DAY 01/19/22   Nahser, Deloris Ping, MD  cetirizine (ZYRTEC) 10 MG tablet Take 10 mg by mouth in the morning.    [provider]  Cholecalciferol (VITAMIN D-3) 125 MCG (5000 UT) TABS Take 15,000 Units by mouth in the morning and at bedtime.    [provider]  ciclopirox (PENLAC) 8 % solution APPLY TOPICALLY AT BEDTIME. APPLY OVER NAIL AND SURROUNDING SKIN. APPLY DAILY OVER PREVIOUS COAT. AFTER SEVEN (7) DAYS, MAY REMOVE WITH ALCOHOL AND CONTINUE CYCLE. 10/25/21   Edwin Cap, DPM  clopidogrel (PLAVIX) 75 MG tablet TAKE 1 TABLET BY MOUTH EVERY DAY 06/09/22   Nahser, Deloris Ping, MD  cyclobenzaprine (FLEXERIL) 10 MG tablet Take 1 tablet (10 mg total) by mouth 2 (two) times daily as needed for muscle spasms. Patient not taking: Reported on 06/12/2022 01/10/22   Alvira Monday, MD  Dextromethorphan-buPROPion ER (AUVELITY) 45-105 MG TBCR Take 2 tablets by mouth at bedtime.    [provider]  gabapentin (NEURONTIN) 400 MG capsule Take 400 mg by mouth 2 (two) times daily as needed (pain).     [provider]  ibuprofen (ADVIL) 800 MG tablet Take 800 mg by mouth 3 (three) times daily as needed (pain.). 02/13/19   [provider]  isosorbide mononitrate (IMDUR) 60 MG 24 hr tablet TAKE 1 TABLET BY MOUTH EVERY DAY 10/03/21   Nahser, Deloris Ping, MD  losartan (COZAAR) 100 MG tablet TAKE 1 TABLET BY MOUTH EVERY DAY 04/19/22   Nahser, Deloris Ping, MD  nitroGLYCERIN (NITROSTAT) 0.3 MG SL tablet Place 1 tablet under the tongue every 5 (five) minutes x 3 doses as needed for chest pain. 02/25/19   [provider]  oxyCODONE (ROXICODONE) 5 MG immediate release tablet Take 1 tablet (5 mg total) by mouth every 4 (four) hours as needed for severe  pain. Patient not taking: Reported on 06/12/2022 01/10/22   Alvira Monday, MD  Probiotic Product (ACIDOPHILUS, PROBIOTIC, PO) Take 1 capsule by mouth every evening.    [provider]  ranolazine (RANEXA) 1000 MG SR tablet TAKE 1 TABLET BY MOUTH TWICE A DAY 05/15/22   Nahser, Deloris Ping, MD  REPATHA SURECLICK 140 MG/ML SOAJ SMARTSIG:1 injection SUB-Q Every 2 Weeks 10/25/21   [provider]    Physical Exam    Vital Signs:  John [name] does not have vital signs available for review today.  Given telephonic nature of communication, physical exam is limited. AAOx3. NAD. Normal affect.  Speech and respirations are unlabored.  Accessory Clinical Findings    None  Assessment & Plan    Primary Cardiologist: Kristeen Miss, MD  Preoperative cardiovascular risk assessment.  Epidural steroid injection by Dr. Alvester Morin.  Preoperative clearance could not be given via telehealth visit secondary to patient's complaints of worsening positional dizziness and presyncope. Will have scheduled call patient to set him hip  for an inpatient visit. His risk for the procedure can be evaluated at that time.   Time:   Today, I have spent 8 minutes with the patient with telehealth technology discussing medical history, symptoms, and management plan.     John Leveringeborah Cong Hightower, NP  06/14/2022, 10:29 AM

## 2022-06-15 ENCOUNTER — Ambulatory Visit (INDEPENDENT_AMBULATORY_CARE_PROVIDER_SITE_OTHER): Payer: Managed Care, Other (non HMO) | Admitting: Podiatry

## 2022-06-15 DIAGNOSIS — B351 Tinea unguium: Secondary | ICD-10-CM | POA: Diagnosis not present

## 2022-06-15 MED ORDER — TERBINAFINE HCL 250 MG PO TABS: 250.0000 mg | ORAL_TABLET | Freq: Every day | ORAL | 0 refills | Status: AC

## 2022-06-15 NOTE — Progress Notes (Signed)
Office Visit    Patient Name: John [name] Date of Encounter: 06/15/2022  Primary Care Provider:  Armando Gang, FNP Primary Cardiologist:  Kristeen Miss, MD Primary Electrophysiologist: None  Chief Complaint    John [name] is a 62 y.o. male with PMH of CAD s/p DES/PTCA to mid LAD right to left collaterals with jailed diagonal branch, HTN, HLD who presents today for preoperative clearance visit and recent complaint of dizziness.  Past Medical History    Past Medical History:  Diagnosis Date   Bell's palsy 07/31/2013   Coronary artery disease    Displacement of lumbar intervertebral disc without myelopathy    High cholesterol    Hypertension    Past Surgical History:  Procedure Laterality Date   CORONARY PRESSURE/FFR STUDY N/A 10/06/2020   Procedure: INTRAVASCULAR PRESSURE WIRE/FFR STUDY;  Surgeon: Marykay Lex, MD;  Location: [facility] INVASIVE CV LAB;  Service: Cardiovascular;  Laterality: N/A;   CORONARY STENT INTERVENTION N/A 11/03/2019   Procedure: CORONARY STENT INTERVENTION;  Surgeon: Corky Crafts, MD;  Location: [facility] INVASIVE CV LAB;  Service: Cardiovascular;  Laterality: N/A;   CORONARY ULTRASOUND/IVUS N/A 11/03/2019   Procedure: Intravascular Ultrasound/IVUS;  Surgeon: Corky Crafts, MD;  Location: [facility] INVASIVE CV LAB;  Service: Cardiovascular;  Laterality: N/A;   LEFT HEART CATH AND CORONARY ANGIOGRAPHY N/A 11/03/2019   Procedure: LEFT HEART CATH AND CORONARY ANGIOGRAPHY;  Surgeon: Corky Crafts, MD;  Location: [facility] INVASIVE CV LAB;  Service: Cardiovascular;  Laterality: N/A;   LEFT HEART CATH AND CORONARY ANGIOGRAPHY N/A 10/06/2020   Procedure: LEFT HEART CATH AND CORONARY ANGIOGRAPHY;  Surgeon: Marykay Lex, MD;  Location: [facility] INVASIVE CV LAB;  Service: Cardiovascular;  Laterality: N/A;   WRIST SURGERY Right     Allergies  Allergies  Allergen Reactions   No [employer] Allergies     Shellfish-allergy testing Pecans-allergy  testing [place]-allergy testing cinnamon    History of Present Illness    John [name]  is a 62 year old male with the above mention past medical history who presents today for preoperative clearance and evaluation of dizziness.  Mr. Klug was initially seen by Dr. Elease Hashimoto in 2021 for complaint of chest pain.  He was scheduled for a stress test due to progressive cardiac symptoms left heart cath was arranged.  The left heart cath showed  100% stenosed mLAD with right to left collateral (chronic occlusion) s/p PTCA and DES. IVUS showed no evidence of edge dissection.  Second diagonal was 80% stenosed and could not pass a wire. He was noted to having cartoid calcification on x-ray completed for dental work, where a carotid doppler was completed on 04/27/20 showing R ICA 1-39% stenosis and left carotid extracranial vessels near-normal.   He did well until 06/2020 when he presented to the ED with complaint of chest pain.  He reported waking up from his sleep with severe left-sided chest pain radiating to his left shoulder and neck and behind left ear.  Diagnostic workup was unremarkable and troponins were 14.  He was recommended for outpatient stress test that was completed and showed no evidence of ischemia and was low risk.  He was started on Imdur 30 mg at follow-up in 08/09/2020.  He continued to have exertional symptoms and was seen on 09/27/2020 for follow-up with Dr. Elease Hashimoto and decision was made to have repeat left heart cath.  Cath results showed patent mid LAD stent with branching diagonal with severe ostial disease after  branching off of the LAD which would be a difficult PCI.  He was started on Ranexa 500 mg twice daily and losartan was increased to 100.  He was seen 01/13/2022 with complaint of orthostasis and dizziness.  Mr. Mayford Knife presents today for preoperative clearance visit alone.  Since last being seen in the office patient reports that he has been experiencing bouts of dizziness with position  changes.  He states that when picking something up off of the ground when he stands up he feels dizzy and has to hold onto something to keep from falling.  His blood pressure today is well-controlled at 121/62 and heart rate was 66 bpm.  He is compliant with his current medication regimen.  He denies any chest discomfort at rest but does endorse chest discomfort with emotional upset and climbing up stairs or incline.  Patient denies palpitations, dyspnea, PND, orthopnea, nausea, vomiting, dizziness, syncope, edema, weight gain, or early satiety.   Home Medications    Current Outpatient Medications  Medication Sig Dispense Refill   albuterol (VENTOLIN HFA) 108 (90 Base) MCG/ACT inhaler Inhale 1-2 puffs into the lungs every 6 (six) hours as needed for wheezing or shortness of breath. (Patient not taking: Reported on 06/12/2022)     aspirin EC 81 MG tablet Take 81 mg by mouth in the morning.     atorvastatin (LIPITOR) 80 MG tablet TAKE 1 TABLET BY MOUTH EVERY DAY 90 tablet 3   carvedilol (COREG) 25 MG tablet TAKE 1 TABLET BY MOUTH TWICE A DAY 180 tablet 3   cetirizine (ZYRTEC) 10 MG tablet Take 10 mg by mouth in the morning.     Cholecalciferol (VITAMIN D-3) 125 MCG (5000 UT) TABS Take 15,000 Units by mouth in the morning and at bedtime.     ciclopirox (PENLAC) 8 % solution APPLY TOPICALLY AT BEDTIME. APPLY OVER NAIL AND SURROUNDING SKIN. APPLY DAILY OVER PREVIOUS COAT. AFTER SEVEN (7) DAYS, MAY REMOVE WITH ALCOHOL AND CONTINUE CYCLE. 6.6 mL 0   clopidogrel (PLAVIX) 75 MG tablet TAKE 1 TABLET BY MOUTH EVERY DAY 90 tablet 2   cyclobenzaprine (FLEXERIL) 10 MG tablet Take 1 tablet (10 mg total) by mouth 2 (two) times daily as needed for muscle spasms. (Patient not taking: Reported on 06/12/2022) 20 tablet 0   Dextromethorphan-buPROPion ER (AUVELITY) 45-105 MG TBCR Take 2 tablets by mouth at bedtime.     gabapentin (NEURONTIN) 400 MG capsule Take 400 mg by mouth 2 (two) times daily as needed (pain).       ibuprofen (ADVIL) 800 MG tablet Take 800 mg by mouth 3 (three) times daily as needed (pain.).     isosorbide mononitrate (IMDUR) 60 MG 24 hr tablet TAKE 1 TABLET BY MOUTH EVERY DAY 90 tablet 3   losartan (COZAAR) 100 MG tablet TAKE 1 TABLET BY MOUTH EVERY DAY 90 tablet 0   nitroGLYCERIN (NITROSTAT) 0.3 MG SL tablet Place 1 tablet under the tongue every 5 (five) minutes x 3 doses as needed for chest pain.     oxyCODONE (ROXICODONE) 5 MG immediate release tablet Take 1 tablet (5 mg total) by mouth every 4 (four) hours as needed for severe pain. (Patient not taking: Reported on 06/12/2022) 15 tablet 0   Probiotic Product (ACIDOPHILUS, PROBIOTIC, PO) Take 1 capsule by mouth every evening.     ranolazine (RANEXA) 1000 MG SR tablet TAKE 1 TABLET BY MOUTH TWICE A DAY 180 tablet 3   REPATHA SURECLICK 140 MG/ML SOAJ SMARTSIG:1 injection SUB-Q Every 2 Weeks  terbinafine (LAMISIL) 250 MG tablet Take 1 tablet (250 mg total) by mouth daily. 90 tablet 0   No current facility-administered medications for this visit.     Review of Systems  Please see the history of present illness.    (+) Dizziness (+) Chest pain  All other systems reviewed and are otherwise negative except as noted above.  Physical Exam    Wt Readings from Last 3 Encounters:  04/24/22 225 lb (102.1 kg)  01/13/22 225 lb (102.1 kg)  01/10/22 220 lb (99.8 kg)   ZO:XWRUEVS:There were no vitals filed for this visit.,There is no height or weight on file to calculate BMI.  Constitutional:      Appearance: Healthy appearance. Not in distress.  Neck:     Vascular: JVD normal.  Pulmonary:     Effort: Pulmonary effort is normal.     Breath sounds: No wheezing. No rales. Diminished in the bases Cardiovascular:     Normal rate. Regular rhythm. Normal S1. Normal S2.      Murmurs: There is no murmur.  Edema:    Peripheral edema absent.  Abdominal:     Palpations: Abdomen is soft non tender. There is no hepatomegaly.  Skin:    General: Skin is  warm and dry.  Neurological:     General: No focal deficit present.     Mental Status: Alert and oriented to person, place and time.     Cranial Nerves: Cranial nerves are intact.  EKG/LABS/ Recent Cardiac Studies    ECG personally reviewed by me today -sinus rhythm with a rate of 60s bpm and no acute changes consistent with previous EKG.    Lab Results  Component Value Date   WBC 4.2 01/10/2022   HGB 15.2 01/10/2022   HCT 44.1 01/10/2022   MCV 94.8 01/10/2022   PLT 214 01/10/2022   Lab Results  Component Value Date   CREATININE 0.99 01/10/2022   BUN 10 01/10/2022   NA 143 01/10/2022   K 4.2 01/10/2022   CL 105 01/10/2022   CO2 27 01/10/2022   Lab Results  Component Value Date   ALT 18 01/13/2022   AST 27 01/13/2020   ALKPHOS 74 01/13/2020   BILITOT 0.4 01/13/2020   Lab Results  Component Value Date   CHOL 113 01/13/2022   HDL 72 01/13/2022   LDLCALC 26 01/13/2022   TRIG 74 01/13/2022   CHOLHDL 1.6 01/13/2022    No results found for: "HGBA1C"  Cardiac Studies & Procedures   CARDIAC CATHETERIZATION  CARDIAC CATHETERIZATION 10/06/2020  Narrative   Prox RCA lesion is 60% stenosed.  Prox RCA to Mid RCA lesion is 40% stenosed.  Mid RCA lesion is 50% stenosed. -> Combined RFR (NEGATIVE) - 0.97   Mid LAD to Dist LAD STENT is 5% stenosed.   Jailed 1st Diag lesion is 70% stenosed (stable)   The left ventricular systolic function is normal. The left ventricular ejection fraction is 55-65% by visual estimate.   There is no aortic valve stenosis.  SUMMARY Angiographically stable 2-3-vessel disease: Widely patent mid LAD DES stent with jailed D1 -> D1 does have ostial 70% stenosis which is similar to post PCI at last procedure.  (This vessel was not able to be successfully wired following PCI.  Not favorable for attempted PCI) Mild progression of disease in the RCA (proximal and mid) likely 50 to 60%: RFR negative (0.97) Normal LCx branches. Normal LVEF and  EDP.  RECOMMENDATIONS Recommend titration of medical management. ->  Consider noninvasive stress testing to corroborate potential ischemia.   Bryan Lemma, MD  Findings Coronary Findings Diagnostic  Dominance: Co-dominant  Left Anterior Descending Vessel is large. Mid LAD to Dist LAD lesion is 5% stenosed. The lesion is located at the major branch. The lesion was previously treated using a drug eluting stent over 2 years ago.  First Diagonal Branch Vessel is small in size. 1st Diag lesion is 70% stenosed. Vessel is jailed by the original stent 1 placed.  Not successfully angioplastied at the time because wire went under stent strut.  Appears to be stable from post PCI images.  Lateral First Diagonal Branch Vessel is small in size.  Second Diagonal Branch Vessel is small in size.  Third Diagonal Branch Vessel is small in size.  Ramus Intermedius Vessel is small.  Left Circumflex Vessel is large. Vessel is angiographically normal.  First Obtuse Marginal Branch Vessel is small in size.  Second Obtuse Marginal Branch Vessel is large in size. Vessel is angiographically normal.  Right Coronary Artery Vessel is small. Prox RCA lesion is 60% stenosed. The lesion is discrete and concentric. RFR are negative -&gt; 0.93 measuring into R VM, 0.97 in the main RCA Prox RCA to Mid RCA lesion is 40% stenosed. The lesion is focal and eccentric. Mid RCA lesion is 50% stenosed. The lesion is focal and eccentric. Pressure wire/FFR was performed on the lesion. RFR 0.97  Acute Marginal Branch Vessel is small in size.  Right Ventricular Branch Vessel is small in size.  Right Posterior Descending Artery Vessel is small in size.  Intervention  No interventions have been documented.   CARDIAC CATHETERIZATION  CARDIAC CATHETERIZATION 11/03/2019  Narrative  2nd Diag lesion is 80% stenosed. THis could not be wired through the stent struts, or before stent placement due to the  acute angle.  Mid LAD lesion is 100% stenosed. Right to left collaterals. This was a chronic total occlusion.  A drug-eluting stent was successfully placed across the CTO using a SYNERGY XD 2.75X48, postdilated to 3.5 mm proximally and optimized with IVUS.  Post intervention, there is a 0% residual stenosis. IVUS showed no evidence of edge dissection.  Ost LAD to Prox LAD lesion is 25% stenosed.  Prox RCA to Mid RCA lesion is 50% stenosed. Diffuse disease in the mid portion.  Dist LAD lesion is 25% stenosed.  The left ventricular systolic function is normal.  LV end diastolic pressure is normal.  The left ventricular ejection fraction is 55-65% by visual estimate.  There is no aortic valve stenosis.  Continue aggressive secondary prevention.  DAPT for 12 months.  Brilinta for at least 1 month.  Could switch to Plavix if there is an issue getting Brilinta.  If he has further angina, could re-evaluate the RCA with DFR, but I think his occluded LAD was the culprit.  Findings Coronary Findings Diagnostic  Dominance: Right  Left Anterior Descending Ost LAD to Prox LAD lesion is 25% stenosed. Mid LAD lesion is 100% stenosed. IVUS done after PTCA with 2.5 balloon. Dist LAD lesion is 25% stenosed. Post PCI, there was distal LAD spasm, likely related to IVUS placement.  This resolved after IC NTG.  Second Diagonal Branch 2nd Diag lesion is 80% stenosed. We were able to wire this after the stent in the LAD but the balloon would not cross, indicating that the wire was behind a stent strut.  After the wire was removed from the diagonal, it could not be re-wired due to the acute  bend.  Left Circumflex Vessel is large. The vessel exhibits minimal luminal irregularities.  First Obtuse Marginal Branch Vessel is large in size.  Second Obtuse Marginal Branch Vessel is large in size.  Right Coronary Artery Vessel is small. Prox RCA to Mid RCA lesion is 50%  stenosed.  Intervention  Mid LAD lesion Stent CATH LAUNCHER 6FR EBU 3.75 guide catheter was inserted. Lesion crossed with guidewire using a WIRE ASAHI PROWATER 180CM. Pre-stent angioplasty was performed using a BALLOON SAPPHIRE 2.0X12. A drug-eluting stent was successfully placed using a SYNERGY XD [id number]. Stent strut is well apposed. Post-stent angioplasty was performed using a BALLOON SAPPHIRE Peterson 3.5X18. Post-Intervention Lesion Assessment The intervention was successful. Pre-interventional TIMI flow is 3. Post-intervention TIMI flow is 3. No complications occurred at this lesion. There is a 0% residual stenosis post intervention.   STRESS TESTS  MYOCARDIAL PERFUSION IMAGING 05/02/2021  Narrative   No ST deviation was noted during exercise.   LV perfusion is normal. There is no evidence of ischemia at heart rate achieved. There is no evidence of infarction.   Left ventricular function is normal. Nuclear stress EF: 63 %. The left ventricular ejection fraction is normal (55-65%). End diastolic cavity size is normal. End systolic cavity size is normal.   Prior study available for comparison from 07/12/2020.   The perfusion study is normal but overall study is nondiagnostic due to submaximal heart rate achieved. The study was terminated early due to chest pain.              Assessment & Plan    1.  Surgical clearance: Mr. Postlewait perioperative risk of a major cardiac event is  % according to the Revised Cardiac Risk Index (RCRI).  Therefore, he is at low risk for perioperative complications.   His functional capacity is fair at 4.64 METs according to the Duke Activity Status Index (DASI). Recommendations: According to ACC/AHA guidelines, no further cardiovascular testing needed.  The patient may proceed to surgery at acceptable risk.   Antiplatelet and/or Anticoagulation Recommendations: Clopidogrel (Plavix) can be held for 5-7 days prior to his surgery and resumed as soon as possible post  op.   2.  Dizziness: -Patient reports dizziness with positional changes  -We will decrease his Imdur to 30 mg daily with as needed 30 mg for increased chest pain. -We will also decrease Ranexa to 1000 mg daily  3.  Coronary artery disease: -s/p DES/PTCA to mid LAD right to left collaterals with jailed diagonal branch -Exercise Myoview completed in January that was low risk. -Continue current GDMT with Plavix 75 mg daily ASA 81 mg, Lipitor 80 mg daily, carvedilol 25 mg twice daily Imdur 15 mg daily, Ranexa 500 mg twice daily  4.  Essential hypertension: -Patient's blood pressure today was well-controlled at 121/62 -Continue losartan 100 mg daily  5.  Hyperlipidemia: -Patient's last LDL cholesterol was 26 on 03/6107 -Continue atorvastatin 80 mg daily  Disposition: Follow-up with Kristeen Miss, MD as scheduled    Medication Adjustments/Labs and Tests Ordered: Current medicines are reviewed at length with the patient today.  Concerns regarding medicines are outlined above.   Signed, Napoleon Form, Leodis Rains, NP 06/15/2022, 12:59 PM [facility]  Note:  This document was prepared using Dragon voice recognition software and may include unintentional dictation errors.

## 2022-06-16 ENCOUNTER — Encounter: Payer: Self-pay | Admitting: Nurse Practitioner

## 2022-06-16 ENCOUNTER — Ambulatory Visit: Payer: Managed Care, Other (non HMO) | Attending: Nurse Practitioner | Admitting: Nurse Practitioner

## 2022-06-16 VITALS — BP 121/62 | HR 66 | Ht 72.0 in | Wt 217.8 lb

## 2022-06-16 DIAGNOSIS — Z01810 Encounter for preprocedural cardiovascular examination: Secondary | ICD-10-CM | POA: Diagnosis not present

## 2022-06-16 DIAGNOSIS — I10 Essential (primary) hypertension: Secondary | ICD-10-CM | POA: Diagnosis not present

## 2022-06-16 DIAGNOSIS — E785 Hyperlipidemia, unspecified: Secondary | ICD-10-CM

## 2022-06-16 DIAGNOSIS — I25118 Atherosclerotic heart disease of native coronary artery with other forms of angina pectoris: Secondary | ICD-10-CM

## 2022-06-16 MED ORDER — ISOSORBIDE MONONITRATE ER 30 MG PO TB24: ORAL_TABLET | ORAL | 3 refills | Status: DC

## 2022-06-16 MED ORDER — RANOLAZINE ER 500 MG PO TB12: 500.0000 mg | ORAL_TABLET | Freq: Two times a day (BID) | ORAL | 3 refills | Status: DC

## 2022-06-16 NOTE — Patient Instructions (Addendum)
Medication Instructions:  Your physician has recommended you make the following change in your medication:   REDUCE the Imdur to 30 mg taking 1 daily and you may take 1 extra tablet as needed for chest pain  REDUCE the Ranexa to 500 mg taking 1 tablet twice a day   *If you need a refill on your cardiac medications before your next appointment, please call your pharmacy*   Lab Work: None ordered  If you have labs (blood work) drawn today and your tests are completely normal, you will receive your results only by: MyChart Message (if you have MyChart) OR A paper copy in the mail If you have any lab test that is abnormal or we need to change your treatment, we will call you to review the results.   Testing/Procedures: None ordered   Follow-Up: At [facility], you and your health needs are our priority.  As part of our continuing mission to provide you with exceptional heart care, we have created designated Provider Care Teams.  These Care Teams include your primary Cardiologist (physician) and Advanced Practice Providers (APPs -  Physician Assistants and Nurse Practitioners) who all work together to provide you with the care you need, when you need it.  We recommend signing up for the patient portal called "MyChart".  Sign up information is provided on this After Visit Summary.  MyChart is used to connect with patients for Virtual Visits (Telemedicine).  Patients are able to view lab/test results, encounter notes, upcoming appointments, etc.  Non-urgent messages can be sent to your provider as well.   To learn more about what you can do with MyChart, go to [email or web address].    Your next appointment:   As scheduled   Provider:   Kristeen Miss, MD     Other Instructions

## 2022-06-18 NOTE — Progress Notes (Signed)
  Subjective:  Patient ID: John [name], male    DOB: [birth year],  MRN: [id number]  Chief Complaint  Patient presents with   Nail Problem    left great toenail deformity    62 y.o. male presents with the above complaint. History confirmed with patient.   Objective:  Physical Exam: warm, good capillary refill, no trophic changes or ulcerative lesions, normal DP and PT pulses, normal sensory exam, and onychomycosis.      Assessment:   1. Onychomycosis      Plan:  Patient was evaluated and treated and all questions answered.  Onychomycosis -Educated on etiology of nail fungus. -Discussed oral topical and laser therapy and risks and benefits of each -eRx for oral terbinafine #90. Educated on risks and benefits of the medication. -Photographs taken   Return in about 4 months (around 10/15/2022) for painful thick fungal nails.

## 2022-06-21 ENCOUNTER — Ambulatory Visit (INDEPENDENT_AMBULATORY_CARE_PROVIDER_SITE_OTHER): Payer: Managed Care, Other (non HMO) | Admitting: Physical Medicine and Rehabilitation

## 2022-06-21 ENCOUNTER — Other Ambulatory Visit: Payer: Self-pay

## 2022-06-21 VITALS — BP 159/67 | HR 84

## 2022-06-21 DIAGNOSIS — M5412 Radiculopathy, cervical region: Secondary | ICD-10-CM | POA: Diagnosis not present

## 2022-06-21 MED ORDER — METHYLPREDNISOLONE ACETATE 80 MG/ML IJ SUSP
80.0000 mg | Freq: Once | INTRAMUSCULAR | Status: AC
  Administered 2022-06-21: 80 mg

## 2022-06-21 NOTE — Progress Notes (Signed)
Functional Pain Scale - descriptive words and definitions  Distracting (5)    Aware of pain/able to complete some ADL's but limited by pain/sleep is affected and active distractions are only slightly useful. Moderate range order  Average Pain 5   +Driver, -BT off plavix, -Dye Allergies.  Left arm pain that radiates from shoulder to wrist

## 2022-06-21 NOTE — Patient Instructions (Signed)

## 2022-07-01 NOTE — Progress Notes (Signed)
John [name] - 62 y.o. male MRN [id number]  Date of birth: November 11, 1960  Office Visit Note: Visit Date: 06/21/2022 PCP: Armando Gang, FNP Referred by: London Sheer, MD  Subjective: Chief Complaint  Patient presents with   Neck - Pain   HPI:  John [name] is a 62 y.o. male who comes in today at the request of Dr. Willia Craze for planned Left C7-T1 Cervical Interlaminar epidural steroid injection with fluoroscopic guidance.  The patient has failed conservative care including home exercise, medications, time and activity modification.  This injection will be diagnostic and hopefully therapeutic.  Please see requesting physician notes for further details and justification.   ROS Otherwise per HPI.  Assessment & Plan: Visit Diagnoses:    ICD-10-CM   1. Radiculopathy, cervical region  M54.12 XR C-ARM NO REPORT    Epidural Steroid injection    methylPREDNISolone acetate (DEPO-MEDROL) injection 80 mg      Plan: No additional findings.   Meds & Orders:  Meds ordered this encounter  Medications   methylPREDNISolone acetate (DEPO-MEDROL) injection 80 mg    Orders Placed This Encounter  Procedures   XR C-ARM NO REPORT   Epidural Steroid injection    Follow-up: Return for visit to requesting provider as needed.   Procedures: No procedures performed  Cervical Epidural Steroid Injection - Interlaminar Approach with Fluoroscopic Guidance  Patient: John [name]      Date of Birth: [birth year] MRN: [id number] PCP: Armando Gang, FNP      Visit Date: 06/21/2022   Universal Protocol:    Date/Time: 06/30/2408:34 AM  Consent Given By: the patient  Position: PRONE  Additional Comments: Vital signs were monitored before and after the procedure. Patient was prepped and draped in the usual sterile fashion. The correct patient, procedure, and site was verified.   Injection Procedure Details:   Procedure diagnoses: Radiculopathy, cervical region [M54.12]     Meds Administered:  Meds ordered this encounter  Medications   methylPREDNISolone acetate (DEPO-MEDROL) injection 80 mg     Laterality: Left  Location/Site: C7-T1  Needle: 3.5 in., 20 ga. Tuohy  Needle Placement: Paramedian epidural space  Findings:  -Comments: Excellent flow of contrast into the epidural space.  Procedure Details: Using a paramedian approach from the side mentioned above, the region overlying the inferior lamina was localized under fluoroscopic visualization and the soft tissues overlying this structure were infiltrated with 4 ml. of 1% Lidocaine without Epinephrine. A # 20 gauge, Tuohy needle was inserted into the epidural space using a paramedian approach.  The epidural space was localized using loss of resistance along with contralateral oblique bi-planar fluoroscopic views.  After negative aspirate for air, blood, and CSF, a 2 ml. volume of Isovue-250 was injected into the epidural space and the flow of contrast was observed. Radiographs were obtained for documentation purposes.   The injectate was administered into the level noted above.  Additional Comments:  No complications occurred Dressing: 2 x 2 sterile gauze and Band-Aid    Post-procedure details: Patient was observed during the procedure. Post-procedure instructions were reviewed.  Patient left the clinic in stable condition.   Clinical History: MRI CERVICAL SPINE WITHOUT CONTRAST   TECHNIQUE: Multiplanar, multisequence MR imaging of the cervical spine was performed. No intravenous contrast was administered.   COMPARISON:  09/10/2013 MRI cervical spine   FINDINGS: Alignment: Trace anterolisthesis of C7 on T1.   Vertebrae: No fracture, evidence of discitis, or bone lesion. Congenitally short pedicles, which narrow  the AP diameter of the spinal canal.   Cord: Normal signal and morphology.   Posterior Fossa, vertebral arteries, paraspinal tissues: Negative.   Disc levels:    C2-C3: Small central disc protrusion. Facet arthropathy. No significant spinal canal stenosis or neural foraminal narrowing.   C3-C4: Small central disc protrusion. Facet arthropathy. Ligamentum flavum hypertrophy. Mild spinal canal stenosis and mild right neural foraminal narrowing, which have progressed since prior exam.   C4-C5: No significant disc bulge. Facet and uncovertebral hypertrophy. Ligamentum flavum hypertrophy. Mild spinal canal stenosis, which has progressed since prior exam. No neural foraminal narrowing.   C5-C6: Minimal disc bulge. Facet and uncovertebral hypertrophy. Ligamentum flavum hypertrophy. Mild spinal canal stenosis, mild-to-moderate right neural foraminal narrowing, and mild left neural foraminal narrowing, which have progressed since the prior exam.   C6-C7: Mild disc bulge. Ligamentum flavum hypertrophy. Facet and uncovertebral hypertrophy. Mild-to-moderate spinal canal stenosis, which has progressed since the prior exam. Mild right and moderate left neural foraminal narrowing, unchanged.   C7-T1: Trace anterolisthesis with disc unroofing. Facet and uncovertebral hypertrophy. No spinal canal stenosis. Moderate left neural foraminal narrowing, which has progressed since prior exam.   IMPRESSION: 1. C6-C7 mild-to-moderate spinal canal stenosis with moderate left and mild right neural foraminal narrowing. 2. C5-C6 mild spinal canal stenosis, mild-to-moderate right neural foraminal narrowing, and mild left neural foraminal narrowing. 3. C7-T1 moderate left neural foraminal narrowing. 4. C3-C4 mild spinal canal stenosis and mild right neural foraminal narrowing. 5. C4-C5 mild spinal canal stenosis.     Electronically Signed   By: Wiliam Ke M.D.   On: 04/07/2022 20:41     Objective:  VS:  HT:    WT:   BMI:     BP:(!) 159/67  HR:84bpm  TEMP: ( )  RESP:  Physical Exam Vitals and nursing note reviewed.  Constitutional:      General: He  is not in acute distress.    Appearance: Normal appearance. He is not ill-appearing.  HENT:     Head: Normocephalic and atraumatic.     Right Ear: External ear normal.     Left Ear: External ear normal.  Eyes:     Extraocular Movements: Extraocular movements intact.  Cardiovascular:     Rate and Rhythm: Normal rate.     Pulses: Normal pulses.  Abdominal:     General: There is no distension.     Palpations: Abdomen is soft.  Musculoskeletal:        General: No signs of injury.     Cervical back: Neck supple. Tenderness present. No rigidity.     Right lower leg: No edema.     Left lower leg: No edema.     Comments: Patient has good strength in the upper extremities with 5 out of 5 strength in wrist extension long finger flexion APB.  No intrinsic hand muscle atrophy.  Negative Hoffmann's test.  Lymphadenopathy:     Cervical: No cervical adenopathy.  Skin:    Findings: No erythema or rash.  Neurological:     General: No focal deficit present.     Mental Status: He is alert and oriented to person, place, and time.     Sensory: No sensory deficit.     Motor: No weakness or abnormal muscle tone.     Coordination: Coordination normal.  Psychiatric:        Mood and Affect: Mood normal.        Behavior: Behavior normal.      Imaging: No results found.

## 2022-07-01 NOTE — Procedures (Signed)
Cervical Epidural Steroid Injection - Interlaminar Approach with Fluoroscopic Guidance  Patient: John [name]      Date of Birth: October 01, 1960 MRN: [id number] PCP: Armando Gang, FNP      Visit Date: 06/21/2022   Universal Protocol:    Date/Time: 06/30/2408:34 AM  Consent Given By: the patient  Position: PRONE  Additional Comments: Vital signs were monitored before and after the procedure. Patient was prepped and draped in the usual sterile fashion. The correct patient, procedure, and site was verified.   Injection Procedure Details:   Procedure diagnoses: Radiculopathy, cervical region [M54.12]    Meds Administered:  Meds ordered this encounter  Medications   methylPREDNISolone acetate (DEPO-MEDROL) injection 80 mg     Laterality: Left  Location/Site: C7-T1  Needle: 3.5 in., 20 ga. Tuohy  Needle Placement: Paramedian epidural space  Findings:  -Comments: Excellent flow of contrast into the epidural space.  Procedure Details: Using a paramedian approach from the side mentioned above, the region overlying the inferior lamina was localized under fluoroscopic visualization and the soft tissues overlying this structure were infiltrated with 4 ml. of 1% Lidocaine without Epinephrine. A # 20 gauge, Tuohy needle was inserted into the epidural space using a paramedian approach.  The epidural space was localized using loss of resistance along with contralateral oblique bi-planar fluoroscopic views.  After negative aspirate for air, blood, and CSF, a 2 ml. volume of Isovue-250 was injected into the epidural space and the flow of contrast was observed. Radiographs were obtained for documentation purposes.   The injectate was administered into the level noted above.  Additional Comments:  No complications occurred Dressing: 2 x 2 sterile gauze and Band-Aid    Post-procedure details: Patient was observed during the procedure. Post-procedure instructions were  reviewed.  Patient left the clinic in stable condition.

## 2022-07-13 ENCOUNTER — Ambulatory Visit: Payer: Managed Care, Other (non HMO) | Admitting: Orthopedic Surgery

## 2022-07-13 DIAGNOSIS — M79632 Pain in left forearm: Secondary | ICD-10-CM

## 2022-07-13 NOTE — Progress Notes (Signed)
Orthopedic Spine Surgery Office Note   Assessment: Patient is a 62 y.o. male with left dorsal forearm pain.  Has foraminal stenosis on the left at C6/7 and C7/T1.  Possible radiculopathy but did not respond to an injection     Plan:  -Patient has tried physical therapy, over-the-counter medications, oral steroids, gabapentin, narcotic medication, cervical injection -He had gotten relief from injection the past but did not this time -Recommended an EMG/NCS for further workup, referral provided to him today -Patient should return to office in 4 weeks; x-rays at next visit: None     Patient expressed understanding of the plan and all questions were answered to the patient's satisfaction.    __________________________________________________________________________   History: Patient is a 63 y.o. male who has been previously seen in the office for left dorsal forearm pain.  He had a history of pain radiating down from his neck into his hand.  That got better with time but now he has pain over the dorsal aspect of his left forearm.  After last visit, he had a cervical steroid injection.  He said that injection did nothing for him.  It did not even help for a day.  He still has the same pain over the dorsal forearm.  Pain bothers him on a daily basis.  He does not have pain elsewhere in the extremity.  No pain in the right upper extremity.  No symptoms of imbalance or unsteadiness.  No trouble with fine motor skills in the hands.   Previous treatments: physical therapy, over-the-counter medications, oral steroids, gabapentin, narcotic medication, cervical injection   Physical Exam:   BMI of 30.5  General: no acute distress, appears stated age Neurologic: alert, answering questions appropriately, following commands Respiratory: unlabored breathing on room air, symmetric chest rise Psychiatric: appropriate affect, normal cadence to speech     MSK (spine):   -Strength exam                                                    Left                  Right Grip strength                5/5                  5/5 Interosseus                  5/5                  5/5 Wrist extension            5/5                  5/5 Wrist flexion                 5/5                  5/5 Elbow flexion                5/5                  5/5 Deltoid                          5/5  5/5     -Sensory exam                          Sensation intact to light touch in C5-T1 nerve distributions of bilateral upper extremities   -Brachioradialis DTR: 2/4 on the left, 2/4 on the right -Biceps DTR: 2/4 on the left, 2/4 on the right   -Spurling: Negative bilaterally -Clonus: No beats bilaterally  -Interosseous wasting: None seen   Tinel's at wrist: Negative bilaterally Phalen's at wrist: Negative bilaterally Durkan's: Negative bilaterally   Tinel's at elbow: Negative bilaterally   Imaging: XR of the cervical spine from 04/24/2022 was previously independently reviewed and interpreted, showing disc height loss at C6-7 and C7/T1.  Anterolisthesis at C7/T1 that does not shift between flexion and extension views. No fracture or dislocation seen.  No evidence of instability on flexion/extension views.   MRI of the cervical spine from 04/07/2022 was previously independently reviewed and interpreted, showing left C6/7 foraminal stenosis and left C7/T1 foraminal stenosis. Right C5/6 foraminal stenosis.     Patient name: KENWOOD LAWHUN Patient MRN: [id number] Date of visit: 07/13/22

## 2022-07-17 ENCOUNTER — Encounter: Payer: Self-pay | Admitting: Neurology

## 2022-07-22 ENCOUNTER — Other Ambulatory Visit: Payer: Self-pay | Admitting: Cardiovascular Disease

## 2022-08-10 ENCOUNTER — Other Ambulatory Visit: Payer: Self-pay

## 2022-08-10 DIAGNOSIS — R202 Paresthesia of skin: Secondary | ICD-10-CM

## 2022-08-14 ENCOUNTER — Encounter: Payer: Managed Care, Other (non HMO) | Admitting: Neurology

## 2022-09-28 ENCOUNTER — Other Ambulatory Visit: Payer: Self-pay | Admitting: Cardiovascular Disease

## 2022-10-12 ENCOUNTER — Ambulatory Visit: Payer: Self-pay | Admitting: Podiatry

## 2022-10-30 ENCOUNTER — Encounter: Payer: Self-pay | Admitting: Podiatry

## 2022-10-30 ENCOUNTER — Ambulatory Visit (INDEPENDENT_AMBULATORY_CARE_PROVIDER_SITE_OTHER): Payer: PRIVATE HEALTH INSURANCE | Admitting: Podiatry

## 2022-10-30 DIAGNOSIS — B351 Tinea unguium: Secondary | ICD-10-CM | POA: Diagnosis not present

## 2022-10-30 MED ORDER — TERBINAFINE HCL 250 MG PO TABS: 250.0000 mg | ORAL_TABLET | Freq: Every day | ORAL | 0 refills | Status: DC

## 2022-10-31 NOTE — Progress Notes (Signed)
  Subjective:  Patient ID: John [name], male    DOB: [birth year],  MRN: [id number]  Chief Complaint  Patient presents with   Nail Problem    "Check my nails.  I dropped a piece of wood paneling on my big toe and the nail flipped up.  I kept it washed with soap, put neosporin on it and a bandage."    62 y.o. male presents with the above complaint. History confirmed with patient.  He completed the Lamisil had no side effects, feels like he is showing improvement  Objective:  Physical Exam: warm, good capillary refill, no trophic changes or ulcerative lesions, normal DP and PT pulses, normal sensory exam, and onychomycosis proximal clearing noted.      Assessment:   1. Onychomycosis      Plan:  Patient was evaluated and treated and all questions answered.  Onychomycosis -Improving with oral therapy.  The nails are brittle length and thickness with a sharp nail nipper.  We will continue Lamisil therapy. -eRx for oral terbinafine #90. Educated on risks and benefits of the medication. -Photographs taken   Return in about 4 months (around 03/01/2023) for follow up after nail fungus treatment.

## 2022-11-06 ENCOUNTER — Other Ambulatory Visit: Payer: Self-pay | Admitting: Cardiovascular Disease

## 2022-11-30 ENCOUNTER — Other Ambulatory Visit: Payer: Self-pay | Admitting: Cardiovascular Disease

## 2023-02-02 ENCOUNTER — Other Ambulatory Visit: Payer: Self-pay | Admitting: Cardiovascular Disease

## 2023-02-22 ENCOUNTER — Ambulatory Visit (INDEPENDENT_AMBULATORY_CARE_PROVIDER_SITE_OTHER): Payer: No Typology Code available for payment source | Admitting: Podiatry

## 2023-02-22 DIAGNOSIS — B351 Tinea unguium: Secondary | ICD-10-CM | POA: Diagnosis not present

## 2023-02-22 NOTE — Progress Notes (Signed)
  Subjective:  Patient ID: John [name], male    DOB: 01/05/1961,  MRN: [id number]  Chief Complaint  Patient presents with   Nail Problem    He is here for a follow up on right foot big toe fungus. He reports that he thinks the toe is growing crooked and wanted to ask what you think about it.     62 y.o. male presents with the above complaint. History confirmed with patient.  Still improving he has about 20 to 30 days left of Lamisil  Objective:  Physical Exam: warm, good capillary refill, no trophic changes or ulcerative lesions, normal DP and PT pulses, normal sensory exam, and onychomycosis proximal clearing noted.      Assessment:   1. Onychomycosis      Plan:  Patient was evaluated and treated and all questions answered.  Onychomycosis -Has improved quite a bit.  He will complete his current Lamisil course and allow the nail to grow out.  He will follow-up with me in 6 months to reevaluate   Return in about 6 months (around 08/23/2023) for follow up after nail fungus treatment.

## 2023-02-26 ENCOUNTER — Other Ambulatory Visit: Payer: Self-pay | Admitting: Podiatry

## 2023-03-12 NOTE — Progress Notes (Signed)
 Cardiology Office Note:    Date:  03/13/2023   ID:  John [name], DOB 28-Oct-1960, MRN [id number]  PCP:  Donal Channing SQUIBB, FNP  Cardiologist:  Shatera Rennert  Electrophysiologist:  None   Referring MD: Donal Channing SQUIBB, FNP   Chief Complaint  Patient presents with   Coronary Artery Disease        Hyperlipidemia    Previous notes:      John [name] is a 63 y.o. male with a hx of hypertension and hyperlipidemia.  He is seen today for further evaluation of some exertional chest discomfort.  We are asked to see him today by Dr. Fernand  for further evaluation of this exertional chest pain.  Started having exertional CP while push mowing his yard in 2019. Would have to stop and rest. Later , he noticed CP while climbing the [place] steps.  CP was achey,  Would last several minutes.  Would have to stop and relax .  Associated with sweats and dyspnea.   No dizziness  Is having to limit his exercise because of this cp .   Was in the hospital at [facility] with cp.   Does not recall whether or not he had a stress test.    October 07, 2019:  John [name] is seen today for follow-up visit.  We saw him several months ago he was having episodes of chest discomfort.  We discussed heart catheterization and also discussed stress testing.  We schedule him for stress test but he never test done.  His sone passed away in August 12, 2023 and did not make it to the stress appt.  Still has some CP.  Occurs with heavy lifting and stress.  Pains last for a minute or so .  He has been having exertional chest pain for the past year or so.  They seem to be progressing.  January 13, 2020: John [name] is seen back today for follow-up of his coronary artery disease.  He status post stenting of his mid LAD.  There was a second diagonal stenosis that could not be wired because of the previously placed LAD stent.  CP is much better,  Has some doe on occasion .   Jan. 21, 2022: John [name] is seen today for work in visit.   He was recently at the dentist and was noted to have carotid calcifications on xray . 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  Follow-up as needed.   Heart catheterization November 03, 2019 shows that his mid LAD is occluded after the second diagonal.  He has right to left collaterals.  He has a second diagonal vessel that has an 80% stenosis.   The LAD was stented.   This could not be wired through the stent struts because of the acute angle.  The right coronary artery has a mid lesion of 50%. He had stenting across the  LAD CTO by Dr. Dann    Last LDL from Nov 2021 was 52  Was seen by Reche Finder in 08-12-2023 Was started on Isosorbide  Losartan  was reduced to 50 mg a day   Still has chest pain  Cannot walk for long distances  Chest pressure , left chest pressure  Helped by stopping  and taking NTG .   Unable to do his job because of his cp and mental foggyness with exertion Is out of work at present  Was started on Pralulent by his primary - is not on our list   Will set him up for a cath, Increase coreg  to 12.5 BID    Oct 28, 2020  Seen following cath. Personally reviewed Mid LAD stent is patent Branching diag has severe ostial disease just after branching off the LAD - would likely be a difficult PCI. Myoview  in May showed no ischemia   January 25, 2021: John [name] is seen today for follow-up visit of his coronary artery disease. Is slightly better.  Still having some chest pain.   Is exertional  His exercise capacity is limited by his chest pain CP with climbing stairs.  Has a jailed Diag that is could be the cause of his chest pain  Myoview  in May, 2022 showed no ischemia  Is taking NTG regularly - every 2-3 days  We  will increase the carvedilol  to 25 mg twice a day and increase the Ranexa  to 1000 mg twice a day starting today.   Feb. 20, 2023 John [name] is seen for follow up of his CAD,  has a mid LAD stent Has a jailed D1 at the stent location Still having some chest pain with exercise.   Is not able to exercise due to the chest pain  Relieved with SL NTG   Nov. 10, 2023  John [name] is seen today for follow up of his CAD  Has a mid LAD stent with a jailed D2 at the stent location  He has been seen in May , 2023 by Dr. Darron    Has been approved for Repatha Able to do most of his normal exercises  Had an ER visit last week for CP  Troponins were negative   Still having orthostasis   Jan. 7, 2025 John [name] is seen for follow up of his CAD   Is follow up with Dr. Darron in a year   Angina is much better  Is on Ranexa  500 BID  Imdur  30  Will increase Imdur  to 60      Past Medical History:  Diagnosis Date   Bell's palsy 07/31/2013   Coronary artery disease    Displacement of lumbar intervertebral disc without myelopathy    High cholesterol    Hypertension     Past Surgical History:  Procedure Laterality Date   CORONARY PRESSURE/FFR STUDY N/A 10/06/2020   Procedure: INTRAVASCULAR PRESSURE WIRE/FFR STUDY;  Surgeon: Anner Alm ORN, MD;  [facility] INVAS[facility] CV LAB;  Service: Cardiovascular;  Laterality: N/A;   CORONARY STENT INTERVENTION N/A 11/03/2019   Procedure: CORONARY STENT INTERVENTION;  Surgeon: Dann Candyce RAMAN, MD;  Location: [facility] INVASIVE CV LAB;  Service: Cardiovascular;  Laterality: N/A;   CORONARY ULTRASOUND/IVUS N/A 11/03/2019   Procedure: Intravascular Ultrasound/IVUS;  Surgeon: Dann Candyce RAMAN, MD;  Location: [facility] INVASIVE CV LAB;  Service: Cardiovascular;  Laterality: N/A;   LEFT HEART CATH AND CORONARY ANGIOGRAPHY N/A 11/03/2019   Procedure: LEFT HEART CATH AND CORONARY ANGIOGRAPHY;  Surgeon: Dann Candyce RAMAN, MD;  Location: [facility] INVASIVE CV LAB;  Service:  Cardiovascular;  Laterality: N/A;   LEFT HEART CATH AND CORONARY ANGIOGRAPHY N/A 10/06/2020   Procedure: LEFT HEART CATH AND CORONARY ANGIOGRAPHY;  Surgeon: Anner Alm ORN, MD;  Location: [facility] INVASIVE CV LAB;  Service: Cardiovascular;  Laterality: N/A;   WRIST SURGERY Right     Current Medications:  Current Meds  Medication Sig   aspirin  EC 81 MG tablet Take 81 mg by mouth in the morning.   atorvastatin  (LIPITOR) 80 MG tablet TAKE 1 TABLET BY MOUTH EVERY DAY   carvedilol  (COREG ) 25 MG tablet TAKE 1 TABLET BY MOUTH TWICE A DAY   cetirizine (ZYRTEC) 10 MG tablet Take 10 mg by mouth in the morning.   Cholecalciferol (VITAMIN D-3) 125 MCG (5000 UT) TABS Take 15,000 Units by mouth in the morning and at bedtime.   clopidogrel  (PLAVIX ) 75 MG tablet TAKE 1 TABLET BY MOUTH EVERY DAY   Dextromethorphan-buPROPion ER (AUVELITY) 45-105 MG TBCR Take 2 tablets by mouth at bedtime.   gabapentin (NEURONTIN) 400 MG capsule Take 400 mg by mouth 2 (two) times daily as needed (pain).    ibuprofen (ADVIL) 800 MG tablet Take 800 mg by mouth 3 (three) times daily as needed (pain.).   isosorbide  mononitrate (IMDUR ) 60 MG 24 hr tablet Take 1 tablet (60 mg total) by mouth daily.   losartan  (COZAAR ) 100 MG tablet TAKE 1 TABLET BY MOUTH EVERY DAY   nitroGLYCERIN  (NITROSTAT ) 0.3 MG SL tablet Place 1 tablet under the tongue every 5 (five) minutes x 3 doses as needed for chest pain.   Probiotic Product (ACIDOPHILUS, PROBIOTIC, PO) Take 1 capsule by mouth every evening.   ranolazine  (RANEXA ) 500 MG 12 hr tablet Take 1 tablet (500 mg total) by mouth 2 (two) times daily.   REPATHA SURECLICK 140 MG/ML SOAJ SMARTSIG:1 injection SUB-Q Every 2 Weeks   terbinafine  (LAMISIL ) 250 MG tablet TAKE 1 TABLET BY MOUTH EVERY DAY   [DISCONTINUED] isosorbide  mononitrate (IMDUR ) 30 MG 24 hr tablet TAKE 1 TABLET BY MOUTH DAILY, MAY TAKE 1 EXTRA TABLET DAILY AS NEEDED FOR CHEST PAIN     Allergies:   No healthtouch food allergies   Social  History   Socioeconomic History   Marital status: Married    Spouse name: Not on file   Number of children: Not on file   Years of education: Not on file   Highest education level: Not on file  Occupational History   Not on file  Tobacco Use   Smoking status: Former   Smokeless tobacco: Never  Substance and Sexual Activity   Alcohol use: Yes    Comment: occasionally    Drug use: No   Sexual activity: Not on file  Other Topics Concern   Not on file  Social History Narrative   Not on file   Social Drivers of Health   Financial Resource Strain: Not on file  Food Insecurity: Not on file  Transportation Needs: Not on file  Physical Activity: Not on file  Stress: Not on file  Social Connections: Not on file     Family History: The patient's family history includes Diabetes in an other family member; Glaucoma in an other family member; High Cholesterol in an other family member; Hypertension in an other family member; Stroke in an other family member.  ROS:   Please see the history of present illness.     All other systems reviewed and are negative.  EKGs/Labs/Other Studies Reviewed:    The following studies were reviewed today:   Recent Labs: No results found for requested labs within last 365 days.  Recent Lipid Panel    Component Value Date/Time   CHOL 113 01/13/2022 1509   TRIG 74 01/13/2022 1509   HDL 72 01/13/2022 1509   CHOLHDL 1.6 01/13/2022 1509   LDLCALC 26 01/13/2022 1509  Physical Exam:    Physical Exam: Blood pressure 124/84, pulse 68, height 6' (1.829 m), weight 223 lb (101.2 kg), SpO2 98%.     GEN:  Well nourished, well developed in no acute distress HEENT: Normal NECK: No JVD; No carotid bruits LYMPHATICS: No lymphadenopathy CARDIAC: RRR , no murmurs, rubs, gallops RESPIRATORY:  Clear to auscultation without rales, wheezing or rhonchi  ABDOMEN: Soft, non-tender, non-distended MUSCULOSKELETAL:  No edema; No deformity  SKIN: Warm and  dry NEUROLOGIC:  Alert and oriented x 3   EKG:        ASSESSMENT:    1. Coronary artery disease of native artery of native heart with stable angina pectoris (HCC)   2. Hyperlipidemia LDL goal <70   3. CAD S/P percutaneous coronary angioplasty: DES PCI to LAD CTO, jailed diagonal branch   4. Essential hypertension         PLAN:      1.  CAD -   he had successful stenting of his LAD.  In the process he now has a jailed second diagonal.  He he was having frequent episodes of angina.  His angina is much better on isosorbide  and Ranexa .  He still has occasional episodes of angina.  Will increase the isosorbide  to 60 mg a day.  We can consider increasing his Ranexa  to 1000 mg twice a day if he continues to have angina.  He will follow-up with Dr. Francyne  in 6 months.    2.  Hyperlipidemia:    check lipids today     3.   Hypertension:   BP is well controlled.      Medication Adjustments/Labs and Tests Ordered: Current medicines are reviewed at length with the patient today.  Concerns regarding medicines are outlined above.  Orders Placed This Encounter  Procedures   Lipid panel   ALT   Basic metabolic panel    Meds ordered this encounter  Medications   isosorbide  mononitrate (IMDUR ) 60 MG 24 hr tablet    Sig: Take 1 tablet (60 mg total) by mouth daily.    Dispense:  90 tablet    Refill:  3     Patient Instructions  Medication Instructions:  Your physician has recommended you make the following change in your medication:   1) INCREASE isosorbide  mononitrate (Imdur ) to 60 mg daily  *If you need a refill on your cardiac medications before your next appointment, please call your pharmacy*  Lab Work: TODAY: Lipid panel, ALT, BMP If you have labs (blood work) drawn today and your tests are completely normal, you will receive your results only by: MyChart Message (if you have MyChart) OR A paper copy in the mail If you have any lab test that is abnormal or we  need to change your treatment, we will call you to review the results.  Testing/Procedures: None ordered today.  Follow-Up: At [facility], you and your health needs are our priority.  As part of our continuing mission to provide you with exceptional heart care, we have created designated Provider Care Teams.  These Care Teams include your primary Cardiologist (physician) and Advanced Practice Providers (APPs -  Physician Assistants and Nurse Practitioners) who all work together to provide you with the care you need, when you need it.   Your next appointment:   6 month(s)  The format for your next appointment:   In Person  Provider:   Deatrice Cage, MD   Signed, Aleene Passe, MD  03/13/2023 11:40 AM  [facility]

## 2023-03-13 ENCOUNTER — Ambulatory Visit
Payer: No Typology Code available for payment source | Attending: Cardiovascular Disease | Admitting: Cardiovascular Disease

## 2023-03-13 ENCOUNTER — Encounter: Payer: Self-pay | Admitting: Cardiovascular Disease

## 2023-03-13 VITALS — BP 124/84 | HR 68 | Ht 72.0 in | Wt 223.0 lb

## 2023-03-13 DIAGNOSIS — E785 Hyperlipidemia, unspecified: Secondary | ICD-10-CM | POA: Diagnosis not present

## 2023-03-13 DIAGNOSIS — I25118 Atherosclerotic heart disease of native coronary artery with other forms of angina pectoris: Secondary | ICD-10-CM

## 2023-03-13 DIAGNOSIS — I10 Essential (primary) hypertension: Secondary | ICD-10-CM

## 2023-03-13 DIAGNOSIS — I2510 Atherosclerotic heart disease of native coronary artery without angina pectoris: Secondary | ICD-10-CM | POA: Diagnosis not present

## 2023-03-13 DIAGNOSIS — Z9861 Coronary angioplasty status: Secondary | ICD-10-CM

## 2023-03-13 MED ORDER — ISOSORBIDE MONONITRATE ER 60 MG PO TB24: 60.0000 mg | ORAL_TABLET | Freq: Every day | ORAL | 3 refills | Status: DC

## 2023-03-13 NOTE — Patient Instructions (Signed)
 Medication Instructions:  Your physician has recommended you make the following change in your medication:   1) INCREASE isosorbide  mononitrate (Imdur ) to 60 mg daily  *If you need a refill on your cardiac medications before your next appointment, please call your pharmacy*  Lab Work: TODAY: Lipid panel, ALT, BMP If you have labs (blood work) drawn today and your tests are completely normal, you will receive your results only by: MyChart Message (if you have MyChart) OR A paper copy in the mail If you have any lab test that is abnormal or we need to change your treatment, we will call you to review the results.  Testing/Procedures: None ordered today.  Follow-Up: At [facility], you and your health needs are our priority.  As part of our continuing mission to provide you with exceptional heart care, we have created designated Provider Care Teams.  These Care Teams include your primary Cardiologist (physician) and Advanced Practice Providers (APPs -  Physician Assistants and Nurse Practitioners) who all work together to provide you with the care you need, when you need it.   Your next appointment:   6 month(s)  The format for your next appointment:   In Person  Provider:   Deatrice Cage, MD

## 2023-03-14 LAB — BASIC METABOLIC PANEL
BUN/Creatinine Ratio: 10 (ref 10–24)
BUN: 10 mg/dL (ref 8–27)
CO2: 25 mmol/L (ref 20–29)
Calcium: 9.8 mg/dL (ref 8.6–10.2)
Chloride: 105 mmol/L (ref 96–106)
Creatinine, Ser: 1 mg/dL (ref 0.76–1.27)
Glucose: 100 mg/dL — ABNORMAL HIGH (ref 70–99)
Potassium: 5.5 mmol/L — ABNORMAL HIGH (ref 3.5–5.2)
Sodium: 144 mmol/L (ref 134–144)
eGFR: 84 mL/min/{1.73_m2} (ref >59)

## 2023-03-14 LAB — LIPID PANEL
Chol/HDL Ratio: 3.1 {ratio} (ref 0.0–5.0)
Cholesterol, Total: 141 mg/dL (ref 100–199)
HDL: 46 mg/dL (ref >39)
LDL Chol Calc (NIH): 69 mg/dL (ref 0–99)
Triglycerides: 148 mg/dL (ref 0–149)
VLDL Cholesterol Cal: 26 mg/dL (ref 5–40)

## 2023-03-14 LAB — ALT: ALT: 21 [IU]/L (ref 0–44)

## 2023-03-16 ENCOUNTER — Telehealth: Payer: Self-pay

## 2023-03-16 DIAGNOSIS — E875 Hyperkalemia: Secondary | ICD-10-CM

## 2023-03-16 NOTE — Telephone Encounter (Signed)
-----   Message from Aleene Passe sent at 03/16/2023  9:33 AM EST ----- Potassium is mildly elevated  If he is eating lots of foods that are high in potassium, he should reduce his intake of these foods Please recheck BMP in 2 weeks  Lipids look great  ALT is normal

## 2023-03-16 NOTE — Telephone Encounter (Signed)
 Called and spoke with patient who states he's a vegetarian and has been for last 8 years. Has not made recent diet changes, but has been eating vegetable soup recently. Agrees to repeat labs in 2 weeks. Order placed at this time.

## 2023-03-28 ENCOUNTER — Encounter: Payer: Self-pay | Admitting: Cardiovascular Disease

## 2023-03-28 LAB — BASIC METABOLIC PANEL
BUN/Creatinine Ratio: 11 (ref 10–24)
BUN: 11 mg/dL (ref 8–27)
CO2: 24 mmol/L (ref 20–29)
Calcium: 10 mg/dL (ref 8.6–10.2)
Chloride: 102 mmol/L (ref 96–106)
Creatinine, Ser: 1.02 mg/dL (ref 0.76–1.27)
Glucose: 99 mg/dL (ref 70–99)
Potassium: 4.4 mmol/L (ref 3.5–5.2)
Sodium: 141 mmol/L (ref 134–144)
eGFR: 82 mL/min/{1.73_m2} (ref >59)

## 2023-05-08 ENCOUNTER — Encounter (INDEPENDENT_AMBULATORY_CARE_PROVIDER_SITE_OTHER): Payer: Self-pay | Admitting: Otolaryngology

## 2023-05-22 ENCOUNTER — Ambulatory Visit: Admitting: Podiatry

## 2023-05-29 ENCOUNTER — Other Ambulatory Visit: Payer: Self-pay | Admitting: Cardiovascular Disease

## 2023-05-29 ENCOUNTER — Telehealth: Payer: Self-pay | Admitting: Cardiovascular Disease

## 2023-05-29 ENCOUNTER — Other Ambulatory Visit: Payer: Self-pay | Admitting: Nurse Practitioner

## 2023-05-29 NOTE — Telephone Encounter (Signed)
*  STAT* If patient is at the pharmacy, call can be transferred to refill team.   1. Which medications need to be refilled? (please list name of each medication and dose if known) clopidogrel (PLAVIX) 75 MG tablet    2. Would you like to learn more about the convenience, safety, & potential cost savings by using the [facility]? No    3. Are you open to using the [facility] (Type [facility].  ). No   4. Which pharmacy/location (including street and city if local pharmacy) is medication to be sent to? [id number] - [place], [place] - [address]    5. Do they need a 30 day or 90 day supply? 90 day

## 2023-05-30 MED ORDER — CLOPIDOGREL BISULFATE 75 MG PO TABS: 75.0000 mg | ORAL_TABLET | Freq: Every day | ORAL | 2 refills | Status: DC

## 2023-05-30 NOTE — Telephone Encounter (Signed)
 Pt's medication was sent to pt's pharmacy as requested. Confirmation received.

## 2023-06-05 ENCOUNTER — Ambulatory Visit: Admitting: Cardiovascular Disease

## 2023-06-15 ENCOUNTER — Telehealth: Payer: Self-pay | Admitting: Cardiovascular Disease

## 2023-06-15 NOTE — Telephone Encounter (Signed)
 Left voicemail to return call to office

## 2023-06-15 NOTE — Telephone Encounter (Signed)
 Pt c/o medication issue:  1. Name of Medication: ranolazine (RANEXA) 500 MG 12 hr tablet   2. How are you currently taking this medication (dosage and times per day)? 500 mg, 2 times daily   3. Are you having a reaction (difficulty breathing--STAT)? No  4. What is your medication issue? New insurance not covering med per pharmacy, $440, requesting cb to discuss

## 2023-06-24 ENCOUNTER — Other Ambulatory Visit: Payer: Self-pay | Admitting: Nurse Practitioner

## 2023-06-28 NOTE — Telephone Encounter (Signed)
 Called pharmacy to see why copay was so high as reported. Technician states that patient picked up this medication on 06/15/23 and paid $40.11. will close encounter.

## 2023-08-20 ENCOUNTER — Other Ambulatory Visit: Payer: Self-pay | Admitting: Nurse Practitioner

## 2023-08-23 ENCOUNTER — Ambulatory Visit: Payer: Self-pay | Admitting: Podiatry

## 2023-09-06 ENCOUNTER — Ambulatory Visit: Admitting: Podiatry

## 2023-09-06 DIAGNOSIS — B351 Tinea unguium: Secondary | ICD-10-CM

## 2023-09-06 NOTE — Progress Notes (Signed)
  Subjective:  Patient ID: John [name], male    DOB: June 18, 1960,  MRN: [id number]  Chief Complaint  Patient presents with   Nail Problem    Rm21 follow up nail fungus     63 y.o. male presents with the above complaint. History confirmed with patient.    Objective:  Physical Exam: warm, good capillary refill, no trophic changes or ulcerative lesions, normal DP and PT pulses, normal sensory exam, and onychomycosis has resolved some residual thickening and dystrophy right hallux nail   Assessment:   1. Onychomycosis       Plan:  Patient was evaluated and treated and all questions answered.  Onychomycosis Doing quite well.  Should allow that nail to grow out.  Discussed with him if any pain or issues with ingrowing nails of the right hallux nail to notify me and we will plan to proceed with a matricectomy of the medial lateral borders of the right hallux.  Follow-up as needed.   No follow-ups on file.

## 2023-10-19 ENCOUNTER — Other Ambulatory Visit: Payer: Self-pay | Admitting: Nurse Practitioner

## 2023-10-19 MED ORDER — CARVEDILOL 25 MG PO TABS: 25.0000 mg | ORAL_TABLET | Freq: Two times a day (BID) | ORAL | 1 refills | Status: DC

## 2023-11-27 ENCOUNTER — Telehealth: Payer: Self-pay | Admitting: Cardiovascular Disease

## 2023-11-27 MED ORDER — ATORVASTATIN CALCIUM 80 MG PO TABS: 80.0000 mg | ORAL_TABLET | Freq: Every day | ORAL | 0 refills | Status: DC

## 2023-11-27 MED ORDER — LOSARTAN POTASSIUM 100 MG PO TABS: 100.0000 mg | ORAL_TABLET | Freq: Every day | ORAL | 0 refills | Status: DC

## 2023-11-27 NOTE — Telephone Encounter (Signed)
 RX sent in

## 2023-11-27 NOTE — Telephone Encounter (Signed)
*  STAT* If patient is at the pharmacy, call can be transferred to refill team.   1. Which medications need to be refilled? (please list name of each medication and dose if known)   atorvastatin  (LIPITOR) 80 MG tablet    losartan  (COZAAR ) 100 MG tablet     4. Which pharmacy/location (including street and city if local pharmacy) is medication to be sent to?  [id number] - [place], [place] - [address]     5. Do they need a 30 day or 90 day supply? 90

## 2023-12-17 ENCOUNTER — Other Ambulatory Visit: Payer: Self-pay | Admitting: Nurse Practitioner

## 2024-03-17 ENCOUNTER — Other Ambulatory Visit: Payer: Self-pay

## 2024-03-18 MED ORDER — ISOSORBIDE MONONITRATE ER 60 MG PO TB24: 60.0000 mg | ORAL_TABLET | Freq: Every day | ORAL | 0 refills | Status: AC

## 2024-04-05 ENCOUNTER — Other Ambulatory Visit: Payer: Self-pay | Admitting: Nurse Practitioner

## 2024-04-11 ENCOUNTER — Telehealth: Payer: Self-pay | Admitting: Cardiovascular Disease

## 2024-04-11 MED ORDER — CLOPIDOGREL BISULFATE 75 MG PO TABS: 75.0000 mg | ORAL_TABLET | Freq: Every day | ORAL | 1 refills | Status: AC

## 2024-04-11 NOTE — Telephone Encounter (Signed)
 Rx sent to pharmacy

## 2024-04-11 NOTE — Telephone Encounter (Signed)
" °*  STAT* If patient is at the pharmacy, call can be transferred to refill team.   1. Which medications need to be refilled? (please list name of each medication and dose if known) clopidogrel  (PLAVIX ) 75 MG tablet    2. Would you like to learn more about the convenience, safety, & potential cost savings by using the [facility]? No    3. Are you open to using the [facility] (Type [facility]. ). No    4. Which pharmacy/location (including street and city if local pharmacy) is medication to be sent to? [id number] - [place], [place] - [address]     5. Do they need a 30 day or 90 day supply? 90  Pt is currently out and has upcoming appt  "

## 2024-05-16 ENCOUNTER — Ambulatory Visit: Admitting: Emergency Medicine
